# Patient Record
Sex: Female | Born: 1980 | Race: White | Hispanic: No | Smoking: Current every day smoker
Health system: Southern US, Community
[De-identification: ages and names within clinical notes are randomized; demographics above are authoritative.]

## PROBLEM LIST (undated history)

## (undated) DIAGNOSIS — E785 Hyperlipidemia, unspecified: Secondary | ICD-10-CM

## (undated) HISTORY — PX: TONSILLECTOMY: SUR1361

---

## 2002-03-16 ENCOUNTER — Emergency Department (HOSPITAL_COMMUNITY): Admission: EM | Admit: 2002-03-16 | Discharge: 2002-03-16 | Payer: Self-pay | Admitting: Emergency Medicine

## 2003-04-29 ENCOUNTER — Emergency Department (HOSPITAL_COMMUNITY): Admission: EM | Admit: 2003-04-29 | Discharge: 2003-04-29 | Payer: Self-pay | Admitting: Emergency Medicine

## 2003-04-29 ENCOUNTER — Encounter: Payer: Self-pay | Admitting: Emergency Medicine

## 2003-09-10 ENCOUNTER — Emergency Department (HOSPITAL_COMMUNITY): Admission: AD | Admit: 2003-09-10 | Discharge: 2003-09-10 | Payer: Self-pay | Admitting: Family Medicine

## 2003-11-23 ENCOUNTER — Emergency Department (HOSPITAL_COMMUNITY): Admission: EM | Admit: 2003-11-23 | Discharge: 2003-11-23 | Payer: Self-pay | Admitting: *Deleted

## 2003-12-11 ENCOUNTER — Ambulatory Visit (HOSPITAL_COMMUNITY): Admission: RE | Admit: 2003-12-11 | Discharge: 2003-12-11 | Payer: Self-pay | Admitting: Specialist

## 2004-01-20 ENCOUNTER — Encounter: Admission: RE | Admit: 2004-01-20 | Discharge: 2004-04-19 | Payer: Self-pay | Admitting: Specialist

## 2004-05-29 ENCOUNTER — Emergency Department (HOSPITAL_COMMUNITY): Admission: EM | Admit: 2004-05-29 | Discharge: 2004-05-29 | Payer: Self-pay | Admitting: Emergency Medicine

## 2004-08-15 ENCOUNTER — Emergency Department (HOSPITAL_COMMUNITY): Admission: EM | Admit: 2004-08-15 | Discharge: 2004-08-15 | Payer: Self-pay | Admitting: Family Medicine

## 2004-08-15 ENCOUNTER — Emergency Department (HOSPITAL_COMMUNITY): Admission: EM | Admit: 2004-08-15 | Discharge: 2004-08-15 | Payer: Self-pay | Admitting: *Deleted

## 2006-09-16 ENCOUNTER — Emergency Department (HOSPITAL_COMMUNITY): Admission: EM | Admit: 2006-09-16 | Discharge: 2006-09-16 | Payer: Self-pay | Admitting: Emergency Medicine

## 2014-02-06 DIAGNOSIS — M549 Dorsalgia, unspecified: Secondary | ICD-10-CM | POA: Insufficient documentation

## 2014-02-06 DIAGNOSIS — G8929 Other chronic pain: Secondary | ICD-10-CM | POA: Insufficient documentation

## 2016-04-07 ENCOUNTER — Other Ambulatory Visit: Payer: Self-pay | Admitting: Physical Medicine and Rehabilitation

## 2016-04-07 ENCOUNTER — Other Ambulatory Visit: Payer: Self-pay

## 2016-04-07 DIAGNOSIS — G971 Other reaction to spinal and lumbar puncture: Principal | ICD-10-CM

## 2017-11-15 DIAGNOSIS — R109 Unspecified abdominal pain: Secondary | ICD-10-CM

## 2017-11-15 DIAGNOSIS — K529 Noninfective gastroenteritis and colitis, unspecified: Secondary | ICD-10-CM

## 2017-11-15 DIAGNOSIS — K6389 Other specified diseases of intestine: Secondary | ICD-10-CM

## 2018-09-10 ENCOUNTER — Emergency Department (HOSPITAL_COMMUNITY): Payer: Self-pay

## 2018-09-10 ENCOUNTER — Emergency Department (HOSPITAL_COMMUNITY)
Admission: EM | Admit: 2018-09-10 | Discharge: 2018-09-10 | Disposition: A | Discharge status: Home / Self Care | Payer: Self-pay | Attending: Emergency Medicine | Admitting: Emergency Medicine

## 2018-09-10 ENCOUNTER — Encounter (HOSPITAL_COMMUNITY): Payer: Self-pay | Admitting: Emergency Medicine

## 2018-09-10 DIAGNOSIS — F17210 Nicotine dependence, cigarettes, uncomplicated: Secondary | ICD-10-CM | POA: Insufficient documentation

## 2018-09-10 DIAGNOSIS — B9789 Other viral agents as the cause of diseases classified elsewhere: Secondary | ICD-10-CM

## 2018-09-10 DIAGNOSIS — J069 Acute upper respiratory infection, unspecified: Secondary | ICD-10-CM | POA: Insufficient documentation

## 2018-09-10 LAB — POC URINE PREG, ED: Preg Test, Ur: NEGATIVE

## 2018-09-10 MED ORDER — BENZONATATE 100 MG PO CAPS: 100.0000 mg | ORAL_CAPSULE | Freq: Three times a day (TID) | ORAL | 0 refills | Status: DC

## 2018-09-10 NOTE — Discharge Instructions (Addendum)
You have been diagnosed today with upper respiratory infection with cough.  At this time there does not appear to be the presence of an emergent medical condition, however there is always the potential for conditions to worsen. Please read and follow the below instructions.  Please return to the Emergency Department immediately for any new or worsening symptoms or if your symptoms do not improve. Please be sure to follow up with your Primary Care Provider as soon as possible regarding your visit today; please call their office to schedule an appointment even if you are feeling better for a follow-up visit. You may use the [employer] as prescribed to help with your cough.  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  May use over-the-counter Tylenol as directed on the packaging for your symptoms.  Contact a health care provider if: You have new symptoms. You cough up pus. Your cough does not get better after 2-3 weeks, or your cough gets worse. You cannot control your cough with suppressant medicines and you are losing sleep. You develop pain that is getting worse or pain that is not controlled with pain medicines. You have a fever. You have unexplained weight loss. You have night sweats. Get help right away if: You cough up blood. You have difficulty breathing. Your heartbeat is very fast. Get help right away if: You have severe or persistent: Headache. Ear pain. Sinus pain. Chest pain. You have chronic lung disease and any of the following: Wheezing. Prolonged cough. Coughing up blood. A change in your usual mucus. You have a stiff neck. You have changes in your: Vision. Hearing. Thinking. Mood.  Please read the additional information packets attached to your discharge summary.  Do not take your medicine if  develop an itchy rash, swelling in your mouth or lips, or difficulty breathing.

## 2018-09-10 NOTE — ED Notes (Signed)
Bed: WTR8 Expected date:  Expected time:  Means of arrival:  Comments: 

## 2018-09-10 NOTE — ED Triage Notes (Signed)
Pt c/o cough, congestion, ear fullness, bilat rib pain from coughing. Sinus pains.

## 2018-09-10 NOTE — ED Provider Notes (Addendum)
[facility]-EMERGENCY DEPT Provider Note   CSN: [id number] Arrival date & time: 09/10/18  1128     History   Chief Complaint Chief Complaint  Patient presents with  . Cough  . Nasal Congestion    HPI Suzanne Myers is a 37 y.o. female presenting for 2 days of rhinorrhea, congestion, bilateral ear fullness and cough.  Patient states that her symptoms have gradually progressed for the last 2 days and have remained constant.  Patient describes her cough as a moderate in intensity, constant cough that is been productive with yellow sputum.  Patient denies hemoptysis.  Patient states that when she has severe coughing fits that she will have mild "rib pain "that improves immediately after she is done coughing.  Patient states that she had bilateral ear fullness for the last 2 days that has improved since this morning.  Patient has been taking "cough and cold medicine" unsure of which medications that this includes.  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  Patient states that she is unsure if she has had a fever however has felt warm over the past day.  Patient denies chest pain, shortness of breath, hemoptysis, extremity swelling/color change, recent surgery/immobilization, history of blood clot, exogenous hormone use.  Patient states that she is an otherwise healthy 37 year old female without history of chronic medical conditions and without daily medication use.  HPI  History reviewed. No pertinent past medical history.  There are no active problems to display for this patient.   Past Surgical History:  Procedure Laterality Date  . TONSILLECTOMY       OB History   None      Home Medications    Prior to Admission medications   Medication Sig Start Date End Date Taking? Authorizing Provider  benzonatate (TESSALON) 100 MG capsule Take 1 capsule (100 mg total) by mouth every 8 (eight) hours.  09/10/18   Bill Salinas, PA-C    Family History No family history on file.  Social History Social History   Tobacco Use  . Smoking status: Current Every Day Smoker    Types: Cigarettes  . Smokeless tobacco: Never Used  Substance Use Topics  . Alcohol use: Not on file  . Drug use: Not on file     Allergies   Patient has no allergy information on record.   Review of Systems Review of Systems  Constitutional: Negative.  Negative for chills, diaphoresis and fever.  HENT: Positive for congestion, ear pain, postnasal drip and rhinorrhea. Negative for drooling, facial swelling, sore throat, trouble swallowing and voice change.   Respiratory: Positive for cough. Negative for shortness of breath.   Cardiovascular: Negative.  Negative for chest pain and leg swelling.  Gastrointestinal: Negative.  Negative for abdominal pain, diarrhea, nausea and vomiting.  Musculoskeletal: Negative.  Negative for arthralgias and myalgias.  Skin: Negative.  Negative for rash.  Neurological: Negative.  Negative for dizziness, weakness and headaches.   Physical Exam Updated Vital Signs BP (!) 124/94 (BP Location: Left Arm)   Pulse 80   Temp 97.8 F (36.6 C) (Oral)   Resp 18   LMP 08/21/2018   SpO2 100%   Physical Exam  Constitutional: She is oriented to person, place, and time. She appears well-developed and well-nourished. No distress.  HENT:  Head: Normocephalic and atraumatic.  Right Ear: Hearing, tympanic membrane, external ear and ear canal normal.  Left Ear: Hearing, tympanic membrane, external  ear and ear canal normal.  Nose: Mucosal edema and rhinorrhea present.  Mouth/Throat: Uvula is midline and oropharynx is clear and moist. No trismus in the jaw. No uvula swelling. No oropharyngeal exudate, posterior oropharyngeal edema or posterior oropharyngeal erythema. Tonsils are 0 on the right. Tonsils are 0 on the left.  The patient has normal phonation and is in control of secretions.  No stridor.  Midline uvula without edema. Soft palate rises symmetrically. Tonsils appear absent; history of tonsillectomy. Tongue protrusion is normal, floor of mouth is soft. No trismus. No creptius on neck palpation and patient has good dentition. No gingival erythema or fluctuance noted. Mucus membranes moist. No pallor noted.  Patient with cobblestoning of the posterior oropharynx consistent with postnasal drip.  Eyes: Pupils are equal, round, and reactive to light. Conjunctivae and EOM are normal.  Neck: Trachea normal, normal range of motion, full passive range of motion without pain and phonation normal. Neck supple. No tracheal tenderness present. No tracheal deviation present.  Cardiovascular:  Pulses:      Dorsalis pedis pulses are 2+ on the right side, and 2+ on the left side.       Posterior tibial pulses are 2+ on the right side, and 2+ on the left side.  Pulmonary/Chest: Effort normal and breath sounds normal. No accessory muscle usage. No respiratory distress. She has no decreased breath sounds. She has no wheezes. She exhibits no crepitus and no deformity.  Patient with mild chest wall tenderness to palpation.  Abdominal: Soft. There is no tenderness. There is no rigidity, no rebound and no guarding.  Musculoskeletal: Normal range of motion.       Right upper arm: Normal.       Left upper arm: Normal.       Right lower leg: Normal.       Left lower leg: Normal.  Neurological: She is alert and oriented to person, place, and time. GCS eye subscore is 4. GCS verbal subscore is 5. GCS motor subscore is 6.  Speech is clear and goal oriented, follows commands Major Cranial nerves without deficit, no facial droop Normal strength in upper and lower extremities bilaterally including dorsiflexion and plantar flexion, strong and equal grip strength Sensation normal to light touch Moves extremities without ataxia, coordination intact Normal gait  Skin: Skin is warm and dry.    Psychiatric: She has a normal mood and affect. Her behavior is normal.   ED Treatments / Results  Labs (all labs ordered are listed, but only abnormal results are displayed) Labs Reviewed  POC URINE PREG, ED    EKG None  Radiology Dg Chest 2 View  Result Date: 09/10/2018 CLINICAL DATA:  Cough and chest congestion. EXAM: CHEST - 2 VIEW COMPARISON:  None. FINDINGS: Heart size is normal. Mediastinal shadows are normal. The lungs are clear. No bronchial thickening. No infiltrate, mass, effusion or collapse. Pulmonary vascularity is normal. No bony abnormality. IMPRESSION: Normal chest Electronically Signed   By: Paulina Fusi M.D.   On: 09/10/2018 15:14    Procedures Procedures (including critical care time)  Medications Ordered in ED Medications - No data to display   Initial Impression / Assessment and Plan / ED Course  I have reviewed the triage vital signs and the nursing notes.  Pertinent labs & imaging results that were available during my care of the patient were reviewed by me and considered in my medical decision making (see chart for details).    Patient with symptoms consistent with URI  for 2 days.  Patient's CXR is negative for acute infiltrate. Symptoms are likely of viral etiology. Discussed that antibiotics are not indicated for viral infections. Patient will be discharged with symptomatic treatment. Patient verbalizes understanding and is agreeable with plan. Patient is hemodynamically stable and in no acute distress prior to discharge.  Patient with mild rib pain only present after coughing fits, mild tenderness to palpation of ribs.  No signs of injury.  No complaint of chest pain/shortness of breath.  Do not suspect ACS/PE at this time, suspect patient with mild muscular skeletal tenderness of following 2 days of coughing.  Patient is low risk Wells and PERC negative.  Patient has been prescribed Tessalon for her cough (denies breast feeding).  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   Patient is afebrile, not tachycardic, not hypotensive, not tachypneic, SPO2 100% on room air, well-appearing and in no acute distress.  At this time there does not appear to be any evidence of an acute emergency medical condition and the patient appears stable for discharge with appropriate outpatient follow up. Diagnosis was discussed with patient who verbalizes understanding of care plan and is agreeable to discharge. I have discussed return precautions with patient and family at bedside who verbalize understanding of return precautions. Patient strongly encouraged to follow-up with their PCP. All questions answered.   Note: Portions of this report may have been transcribed using voice recognition software. Every effort was made to ensure accuracy; however, inadvertent computerized transcription errors may still be present. Final Clinical Impressions(s) / ED Diagnoses   Final diagnoses:  Viral URI with cough    ED Discharge Orders         Ordered    benzonatate (TESSALON) 100 MG capsule  Every 8 hours     09/10/18 1604           Bill Salinas, PA-C 09/10/18 1832    Bill Salinas, PA-C 09/10/18 1832    Alvira Monday, MD 09/12/18 0017

## 2019-06-17 ENCOUNTER — Emergency Department
Admission: EM | Admit: 2019-06-17 | Discharge: 2019-06-17 | Disposition: A | Discharge status: Home / Self Care | Payer: Self-pay | Attending: Emergency Medicine | Admitting: Emergency Medicine

## 2019-06-17 ENCOUNTER — Emergency Department: Payer: Self-pay

## 2019-06-17 ENCOUNTER — Encounter: Payer: Self-pay | Admitting: Emergency Medicine

## 2019-06-17 ENCOUNTER — Other Ambulatory Visit: Payer: Self-pay

## 2019-06-17 DIAGNOSIS — Y939 Activity, unspecified: Secondary | ICD-10-CM | POA: Insufficient documentation

## 2019-06-17 DIAGNOSIS — F17200 Nicotine dependence, unspecified, uncomplicated: Secondary | ICD-10-CM | POA: Insufficient documentation

## 2019-06-17 DIAGNOSIS — Y929 Unspecified place or not applicable: Secondary | ICD-10-CM | POA: Insufficient documentation

## 2019-06-17 DIAGNOSIS — S20211A Contusion of right front wall of thorax, initial encounter: Secondary | ICD-10-CM | POA: Insufficient documentation

## 2019-06-17 DIAGNOSIS — S5012XA Contusion of left forearm, initial encounter: Secondary | ICD-10-CM | POA: Insufficient documentation

## 2019-06-17 DIAGNOSIS — Y999 Unspecified external cause status: Secondary | ICD-10-CM | POA: Insufficient documentation

## 2019-06-17 MED ORDER — OXYCODONE-ACETAMINOPHEN 5-325 MG PO TABS: 1.0000 | ORAL_TABLET | Freq: Four times a day (QID) | ORAL | 0 refills | Status: DC | PRN

## 2019-06-17 MED ORDER — METHOCARBAMOL 500 MG PO TABS: 500.0000 mg | ORAL_TABLET | Freq: Four times a day (QID) | ORAL | 0 refills | Status: DC

## 2019-06-17 MED ORDER — MELOXICAM 15 MG PO TABS: 15.0000 mg | ORAL_TABLET | Freq: Every day | ORAL | 0 refills | Status: DC

## 2019-06-17 MED ORDER — ONDANSETRON HCL 4 MG/2ML IJ SOLN: 4.0000 mg | Freq: Once | INTRAMUSCULAR | Status: DC

## 2019-06-17 MED ORDER — MELOXICAM 7.5 MG PO TABS
15.0000 mg | ORAL_TABLET | Freq: Once | ORAL | Status: AC
  Administered 2019-06-17: 15 mg via ORAL
  Filled 2019-06-17: qty 2

## 2019-06-17 MED ORDER — ONDANSETRON 4 MG PO TBDP
4.0000 mg | ORAL_TABLET | Freq: Once | ORAL | Status: AC
  Administered 2019-06-17: 4 mg via ORAL
  Filled 2019-06-17: qty 1

## 2019-06-17 MED ORDER — OXYCODONE-ACETAMINOPHEN 5-325 MG PO TABS
1.0000 | ORAL_TABLET | Freq: Once | ORAL | Status: AC
  Administered 2019-06-17: 1 via ORAL
  Filled 2019-06-17: qty 1
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
## 2019-06-18 ENCOUNTER — Encounter (HOSPITAL_COMMUNITY): Payer: Self-pay | Admitting: Emergency Medicine
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
## 2021-02-28 ENCOUNTER — Emergency Department (HOSPITAL_BASED_OUTPATIENT_CLINIC_OR_DEPARTMENT_OTHER)
Admission: EM | Admit: 2021-02-28 | Discharge: 2021-02-28 | Disposition: A | Discharge status: Home / Self Care | Payer: 59 | Attending: Emergency Medicine | Admitting: Emergency Medicine

## 2021-02-28 ENCOUNTER — Other Ambulatory Visit: Payer: Self-pay

## 2021-02-28 DIAGNOSIS — F17210 Nicotine dependence, cigarettes, uncomplicated: Secondary | ICD-10-CM | POA: Diagnosis not present

## 2021-02-28 DIAGNOSIS — L309 Dermatitis, unspecified: Secondary | ICD-10-CM

## 2021-02-28 DIAGNOSIS — R21 Rash and other nonspecific skin eruption: Secondary | ICD-10-CM | POA: Insufficient documentation

## 2021-02-28 MED ORDER — PREDNISONE 20 MG PO TABS: ORAL_TABLET | ORAL | 0 refills | Status: DC

## 2021-02-28 MED ORDER — FAMOTIDINE 20 MG PO TABS
20.0000 mg | ORAL_TABLET | Freq: Once | ORAL | Status: AC
  Administered 2021-02-28: 20 mg via ORAL
  Filled 2021-02-28: qty 1

## 2021-02-28 MED ORDER — DIPHENHYDRAMINE HCL 25 MG PO TABS: 25.0000 mg | ORAL_TABLET | Freq: Four times a day (QID) | ORAL | 0 refills | Status: DC | PRN

## 2021-02-28 MED ORDER — PREDNISONE 50 MG PO TABS
60.0000 mg | ORAL_TABLET | Freq: Once | ORAL | Status: AC
  Administered 2021-02-28: 60 mg via ORAL
  Filled 2021-02-28: qty 1

## 2021-02-28 MED ORDER — DIPHENHYDRAMINE HCL 25 MG PO CAPS
25.0000 mg | ORAL_CAPSULE | Freq: Once | ORAL | Status: AC
  Administered 2021-02-28: 25 mg via ORAL
  Filled 2021-02-28: qty 1

## 2021-02-28 NOTE — ED Provider Notes (Signed)
[facility] EMERGENCY DEPT Provider Note   CSN: [id number] Arrival date & time: 02/28/21  0944     History Chief Complaint  Patient presents with  . Rash    Suzanne Myers is a 40 y.o. female.  Patient c/o itchy rash all over body since yesterday. Symptoms acute onset, moderate, sparse, sl raised papules, states yesterday noted a few areas that appeared c/w hives. Denies hx same rash. Denies any change in home or personal products. No change in foods. No recent new meds/med use. Has not taken anything since rash/itching started. Denies outdoor/plant exposures. No recent viral/febrile illness or uri symptoms. No known ill contacts. No fever or chills. Denies change in sleeping areas/sheets or hotel. States other than rash does not feel sick or ill. No mucous membrane or oral lesions.   The history is provided by the patient.  Rash Associated symptoms: no abdominal pain, no diarrhea, no fever, no headaches, no myalgias, no sore throat and not vomiting        No past medical history on file.  There are no problems to display for this patient.   Past Surgical History:  Procedure Laterality Date  . TONSILLECTOMY       OB History   No obstetric history on file.     No family history on file.  Social History   Tobacco Use  . Smoking status: Current Every Day Smoker    Types: Cigarettes  . Smokeless tobacco: Never Used  Vaping Use  . Vaping Use: Every day  Substance Use Topics  . Alcohol use: Not Currently    Home Medications Prior to Admission medications   Medication Sig Start Date End Date Taking? Authorizing Provider  benzonatate (TESSALON) 100 MG capsule Take 1 capsule (100 mg total) by mouth every 8 (eight) hours. 09/10/18   Harlene Salts A, PA-C  meloxicam (MOBIC) 15 MG tablet Take 1 tablet (15 mg total) by mouth daily. 06/17/19   Cuthriell, Delorise Royals, PA-C  methocarbamol (ROBAXIN) 500 MG tablet Take 1 tablet (500 mg total) by mouth 4  (four) times daily. 06/17/19   Cuthriell, Delorise Royals, PA-C  oxyCODONE-acetaminophen (PERCOCET/ROXICET) 5-325 MG tablet Take 1 tablet by mouth every 6 (six) hours as needed for severe pain. 06/17/19   Cuthriell, Delorise Royals, PA-C    Allergies    Codeine  Review of Systems   Review of Systems  Constitutional: Negative for chills and fever.  HENT: Negative for sore throat.   Eyes: Negative for redness and itching.  Respiratory: Negative for cough.   Gastrointestinal: Negative for abdominal pain, diarrhea and vomiting.  Genitourinary: Negative for dysuria and vaginal bleeding.  Musculoskeletal: Negative for myalgias.  Skin: Positive for rash.  Neurological: Negative for headaches.  Hematological: Does not bruise/bleed easily.  Psychiatric/Behavioral: Negative for confusion.    Physical Exam Updated Vital Signs BP (!) 131/99 (BP Location: Left Arm)   Pulse (!) 104   Temp 98.4 F (36.9 C) (Oral)   Resp 18   Ht 1.626 m (5\' 4" )   Wt 86.2 kg   SpO2 98%   BMI 32.61 kg/m   Physical Exam Vitals and nursing note reviewed.  Constitutional:      Appearance: Normal appearance. She is well-developed.  HENT:     Head: Atraumatic.     Nose: Nose normal.     Mouth/Throat:     Mouth: Mucous membranes are moist.     Pharynx: Oropharynx is clear.     Comments: No mm lesions.  Eyes:     General: No scleral icterus.    Conjunctiva/sclera: Conjunctivae normal.     Pupils: Pupils are equal, round, and reactive to light.  Neck:     Trachea: No tracheal deviation.  Cardiovascular:     Rate and Rhythm: Normal rate and regular rhythm.     Pulses: Normal pulses.     Heart sounds: Normal heart sounds. No murmur heard. No friction rub. No gallop.   Pulmonary:     Effort: Pulmonary effort is normal. No respiratory distress.     Breath sounds: Normal breath sounds.  Abdominal:     General: Bowel sounds are normal. There is no distension.     Palpations: Abdomen is soft.     Tenderness: There  is no abdominal tenderness. There is no guarding.  Genitourinary:    Comments: No cva tenderness.  Musculoskeletal:        General: No swelling.     Cervical back: Normal range of motion and neck supple. No rigidity. No muscular tenderness.  Skin:    General: Skin is warm and dry.     Comments: Sparse, mildly erythematous, slightly raised 1-2 mm papular rash to bilateral extremities and trunk. No mm lesions. No palms or soles. No definite areas c/w scabies noted.   Neurological:     Mental Status: She is alert.     Comments: Alert, speech normal.   Psychiatric:        Mood and Affect: Mood normal.     ED Results / Procedures / Treatments   Labs (all labs ordered are listed, but only abnormal results are displayed) Labs Reviewed - No data to display  EKG None  Radiology No results found.  Procedures Procedures   Medications Ordered in ED Medications  diphenhydrAMINE (BENADRYL) capsule 25 mg (25 mg Oral Given 02/28/21 1038)  famotidine (PEPCID) tablet 20 mg (20 mg Oral Given 02/28/21 1038)  predniSONE (DELTASONE) tablet 60 mg (60 mg Oral Given 02/28/21 1038)    ED Course  I have reviewed the triage vital signs and the nursing notes.  Pertinent labs & imaging results that were available during my care of the patient were reviewed by me and considered in my medical decision making (see chart for details).    MDM Rules/Calculators/A&P                         No meds pta.   Benadryl po, pepcid po, prednisone po.  Reviewed nursing notes and prior charts for additional history.   Rec pcp/derm f/u 1 week if symptoms fail to improve/resolve.   Final Clinical Impression(s) / ED Diagnoses Final diagnoses:  None    Rx / DC Orders ED Discharge Orders    None       Cathren Laine, MD 02/28/21 1048

## 2021-02-28 NOTE — ED Triage Notes (Signed)
Pt via pov from work with rash all over her body that she states is itchy and burning. Pt reports that it started last night and that she has never had anything like it before. She also denies any new food or drink or laundry detergent. Pt alert & oriented, nad noted.

## 2021-02-28 NOTE — Discharge Instructions (Addendum)
It was our pleasure to provide your ER care today - we hope that you feel better.  Keep skin clean/dry, avoiding any perfumed soaps, lotions or detergents.   Take prednisone as prescribed. Take benadryl as need.  Follow up with primary care doctor or dermatologist in 1 week if symptoms fail to improve/resolve.  Return to ER if worse, new symptoms, fevers, trouble breathing, or other concern.

## 2021-08-13 ENCOUNTER — Other Ambulatory Visit: Payer: Self-pay

## 2021-08-13 ENCOUNTER — Ambulatory Visit
Admission: RE | Admit: 2021-08-13 | Discharge: 2021-08-13 | Disposition: A | Discharge status: Home / Self Care | Payer: 59 | Source: Ambulatory Visit

## 2021-08-13 VITALS — BP 110/71 | HR 113 | Temp 98.0°F | Resp 18

## 2021-08-13 DIAGNOSIS — M546 Pain in thoracic spine: Secondary | ICD-10-CM | POA: Diagnosis not present

## 2021-08-13 DIAGNOSIS — S46812A Strain of other muscles, fascia and tendons at shoulder and upper arm level, left arm, initial encounter: Secondary | ICD-10-CM | POA: Diagnosis not present

## 2021-08-13 MED ORDER — CYCLOBENZAPRINE HCL 5 MG PO TABS: 5.0000 mg | ORAL_TABLET | Freq: Two times a day (BID) | ORAL | 0 refills | Status: DC | PRN

## 2021-08-13 MED ORDER — IBUPROFEN 600 MG PO TABS: 600.0000 mg | ORAL_TABLET | Freq: Four times a day (QID) | ORAL | 0 refills | Status: DC | PRN
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
## 2021-08-13 NOTE — ED Provider Notes (Signed)
[name]    CSN: [id number] Arrival date & time: 08/13/21  1249      History   Chief Complaint Chief Complaint  Patient presents with  . Back Pain    HPI Suzanne Myers is a 40 y.o. female.   Patient presents with left upper back pain that has been present for approximately 1 week.  Patient reports that pain started in the neck and has now moved down towards the left shoulder blade.  Movement exacerbates pain.  Denies any apparent injury.  Has taken ibuprofen and Tylenol with minimal improvement in symptoms.  Denies chest pain or shortness of breath.   Back Pain  History reviewed. No pertinent past medical history.  There are no problems to display for this patient.   Past Surgical History:  Procedure Laterality Date  . TONSILLECTOMY      OB History   No obstetric history on file.      Home Medications    Prior to Admission medications   Medication Sig Start Date End Date Taking? Authorizing Provider  cyclobenzaprine (FLEXERIL) 5 MG tablet Take 1 tablet (5 mg total) by mouth 2 (two) times daily as needed for muscle spasms. 08/13/21  Yes Lance Muss, FNP  ibuprofen (ADVIL) 600 MG tablet Take 1 tablet (600 mg total) by mouth every 6 (six) hours as needed for mild pain or moderate pain. 08/13/21  Yes Lance Muss, FNP  benzonatate (TESSALON) 100 MG capsule Take 1 capsule (100 mg total) by mouth every 8 (eight) hours. 09/10/18   Harlene Salts A, PA-C  diphenhydrAMINE (BENADRYL) 25 MG tablet Take 1 tablet (25 mg total) by mouth every 6 (six) hours as needed. 02/28/21   Cathren Laine, MD  meloxicam (MOBIC) 15 MG tablet Take 1 tablet (15 mg total) by mouth daily. 06/17/19   Cuthriell, Delorise Royals, PA-C  methocarbamol (ROBAXIN) 500 MG tablet Take 1 tablet (500 mg total) by mouth 4 (four) times daily. 06/17/19   Cuthriell, Delorise Royals, PA-C  oxyCODONE-acetaminophen (PERCOCET/ROXICET) 5-325 MG tablet Take 1 tablet by mouth every 6 (six) hours as needed  for severe pain. 06/17/19   Cuthriell, Delorise Royals, PA-C  phentermine 37.5 MG capsule Take 37.5 mg by mouth daily. 08/05/21   [provider]  predniSONE (DELTASONE) 20 MG tablet 3 po once a day for 2 days, then 2 po once a day for 3 days, then 1 po once a day for 3 days 03/01/21   Cathren Laine, MD    Family History History reviewed. No pertinent family history.  Social History Social History   Tobacco Use  . Smoking status: Every Day    Types: Cigarettes  . Smokeless tobacco: Never  Vaping Use  . Vaping Use: Every day  Substance Use Topics  . Alcohol use: Not Currently     Allergies   Codeine   Review of Systems Review of Systems Per HPI  Physical Exam Triage Vital Signs ED Triage Vitals [08/13/21 1341]  Enc Vitals Group     BP 110/71     Pulse Rate (!) 113     Resp 18     Temp 98 F (36.7 C)     Temp Source Oral     SpO2 98 %     Weight      Height      Head Circumference      Peak Flow      Pain Score 7     Pain Loc  Pain Edu?      Excl. in [place]?    No data found.  Updated Vital Signs BP 110/71 (BP Location: Left Arm)   Pulse (!) 113   Temp 98 F (36.7 C) (Oral)   Resp 18   SpO2 98%   Visual Acuity Right Eye Distance:   Left Eye Distance:   Bilateral Distance:    Right Eye Near:   Left Eye Near:    Bilateral Near:     Physical Exam Constitutional:      General: She is not in acute distress.    Appearance: Normal appearance. She is not toxic-appearing or diaphoretic.  HENT:     Head: Normocephalic and atraumatic.  Eyes:     Extraocular Movements: Extraocular movements intact.     Conjunctiva/sclera: Conjunctivae normal.  Pulmonary:     Effort: Pulmonary effort is normal.  Musculoskeletal:     Cervical back: Tenderness present. No swelling or bony tenderness. Pain with movement present. Normal range of motion.     Thoracic back: Normal.     Lumbar back: Normal.       Back:     Comments: Tenderness to palpation to left  trapezius muscle.  No step-off or direct spinal tenderness.  Skin:    General: Skin is warm and dry.  Neurological:     General: No focal deficit present.     Mental Status: She is alert and oriented to person, place, and time. Mental status is at baseline.  Psychiatric:        Mood and Affect: Mood normal.        Behavior: Behavior normal.        Thought Content: Thought content normal.        Judgment: Judgment normal.     UC Treatments / Results  Labs (all labs ordered are listed, but only abnormal results are displayed) Labs Reviewed - No data to display  EKG   Radiology No results found.  Procedures Procedures (including critical care time)  Medications Ordered in UC Medications - No data to display  Initial Impression / Assessment and Plan / UC Course  I have reviewed the triage vital signs and the nursing notes.  Pertinent labs & imaging results that were available during my care of the patient were reviewed by me and considered in my medical decision making (see chart for details).     Patient's physical exam is most consistent with muscular strain.  Will treat with ibuprofen and muscle relaxer.  Advised patient that muscle relaxer can cause drowsiness.  Patient to follow-up if pain persists.  No red flags seen on exam.  Do not think that imaging is necessary due to area of tenderness on palpation.  Discussed strict return precautions. Patient verbalized understanding and is agreeable with plan.  Final Clinical Impressions(s) / UC Diagnoses   Final diagnoses:  Strain of left trapezius muscle, initial encounter  Acute left-sided thoracic back pain     Discharge Instructions      It seems that you have a muscle strain of your back at your trapezius muscle.  You have been prescribed 2 medications to help alleviate this pain.  Also alternate ice and heat.  Follow-up if pain persists.     ED Prescriptions     Medication Sig Dispense Auth. Provider    cyclobenzaprine (FLEXERIL) 5 MG tablet Take 1 tablet (5 mg total) by mouth 2 (two) times daily as needed for muscle spasms. 15 tablet Lance Muss, FNP  ibuprofen (ADVIL) 600 MG tablet Take 1 tablet (600 mg total) by mouth every 6 (six) hours as needed for mild pain or moderate pain. 30 tablet Lance Muss, FNP      PDMP not reviewed this encounter.   Lance Muss, FNP 08/13/21 1430

## 2021-08-13 NOTE — Discharge Instructions (Addendum)
It seems that you have a muscle strain of your back at your trapezius muscle.  You have been prescribed 2 medications to help alleviate this pain.  Also alternate ice and heat.  Follow-up if pain persists.

## 2021-09-29 ENCOUNTER — Emergency Department (HOSPITAL_COMMUNITY): Payer: 59

## 2021-09-29 ENCOUNTER — Emergency Department (HOSPITAL_COMMUNITY)
Admission: EM | Admit: 2021-09-29 | Discharge: 2021-09-29 | Disposition: A | Discharge status: Home / Self Care | Payer: 59 | Attending: Emergency Medicine | Admitting: Emergency Medicine

## 2021-09-29 ENCOUNTER — Other Ambulatory Visit: Payer: Self-pay

## 2021-09-29 DIAGNOSIS — W25XXXA Contact with sharp glass, initial encounter: Secondary | ICD-10-CM | POA: Diagnosis not present

## 2021-09-29 DIAGNOSIS — S41112A Laceration without foreign body of left upper arm, initial encounter: Secondary | ICD-10-CM | POA: Diagnosis present

## 2021-09-29 DIAGNOSIS — R000 Tachycardia, unspecified: Secondary | ICD-10-CM | POA: Insufficient documentation

## 2021-09-29 DIAGNOSIS — F17210 Nicotine dependence, cigarettes, uncomplicated: Secondary | ICD-10-CM | POA: Diagnosis not present

## 2021-09-29 DIAGNOSIS — Y93E5 Activity, floor mopping and cleaning: Secondary | ICD-10-CM | POA: Diagnosis not present

## 2021-09-29 MED ORDER — IBUPROFEN 400 MG PO TABS
600.0000 mg | ORAL_TABLET | Freq: Once | ORAL | Status: AC
  Administered 2021-09-29: 600 mg via ORAL
  Filled 2021-09-29: qty 1

## 2021-09-29 MED ORDER — LIDOCAINE-EPINEPHRINE (PF) 2 %-1:200000 IJ SOLN
10.0000 mL | Freq: Once | INTRAMUSCULAR | Status: AC
  Administered 2021-09-29: 10 mL via INTRADERMAL
  Filled 2021-09-29: qty 20

## 2021-09-29 NOTE — ED Triage Notes (Signed)
Pt was cleaning and dropped glass jar. When cleaning it up she was holding it in her arm and slid out. 1.5-2 inch Laceration to the left lower arm. Bleeding controlled.

## 2021-09-29 NOTE — ED Provider Notes (Signed)
[facility] EMERGENCY DEPARTMENT Provider Note   CSN: [id number] Arrival date & time: 09/29/21  1605     History Chief Complaint  Patient presents with   Extremity Laceration    Suzanne Myers is a 40 y.o. female who presents with concern for laceration to her left distal forearm. She states she was getting a glass dish down from on top of her refrigerator, when she dropped it and is shattered on the floor. She states she tucked a large piece of it between her forearm and her chest while she was trying to clean up; it slipped and cut her forearm. She states it bled quite a bit so she called EMS who evaluated her and said she should present for repair. She arrives POV.   I have personally reviewed this patient's medical record. She denies any thoughts of self-harm, SI, HI, AVH.   HPI     No past medical history on file.  There are no problems to display for this patient.   Past Surgical History:  Procedure Laterality Date   TONSILLECTOMY       OB History   No obstetric history on file.     No family history on file.  Social History   Tobacco Use   Smoking status: Every Day    Types: Cigarettes   Smokeless tobacco: Never  Vaping Use   Vaping Use: Every day  Substance Use Topics   Alcohol use: Not Currently    Home Medications Prior to Admission medications   Medication Sig Start Date End Date Taking? Authorizing Provider  benzonatate (TESSALON) 100 MG capsule Take 1 capsule (100 mg total) by mouth every 8 (eight) hours. 09/10/18   Harlene Salts A, PA-C  cyclobenzaprine (FLEXERIL) 5 MG tablet Take 1 tablet (5 mg total) by mouth 2 (two) times daily as needed for muscle spasms. 08/13/21   Gustavus Bryant, FNP  diphenhydrAMINE (BENADRYL) 25 MG tablet Take 1 tablet (25 mg total) by mouth every 6 (six) hours as needed. 02/28/21   Cathren Laine, MD  ibuprofen (ADVIL) 600 MG tablet Take 1 tablet (600 mg total) by mouth every 6 (six) hours as needed  for mild pain or moderate pain. 08/13/21   Gustavus Bryant, FNP  meloxicam (MOBIC) 15 MG tablet Take 1 tablet (15 mg total) by mouth daily. 06/17/19   Cuthriell, Delorise Royals, PA-C  methocarbamol (ROBAXIN) 500 MG tablet Take 1 tablet (500 mg total) by mouth 4 (four) times daily. 06/17/19   Cuthriell, Delorise Royals, PA-C  oxyCODONE-acetaminophen (PERCOCET/ROXICET) 5-325 MG tablet Take 1 tablet by mouth every 6 (six) hours as needed for severe pain. 06/17/19   Cuthriell, Delorise Royals, PA-C  phentermine 37.5 MG capsule Take 37.5 mg by mouth daily. 08/05/21   [provider]  predniSONE (DELTASONE) 20 MG tablet 3 po once a day for 2 days, then 2 po once a day for 3 days, then 1 po once a day for 3 days 03/01/21   Cathren Laine, MD    Allergies    Codeine  Review of Systems   Review of Systems  Constitutional: Negative.   HENT: Negative.    Eyes: Negative.   Respiratory: Negative.    Cardiovascular: Negative.   Gastrointestinal: Negative.   Skin:  Positive for wound.  Neurological: Negative.   Hematological: Negative.   Psychiatric/Behavioral: Negative.     Physical Exam Updated Vital Signs BP 122/84 (BP Location: Right Arm)   Pulse 87   Temp 98  F (36.7 C) (Oral)   Resp 13   SpO2 100%   Physical Exam Vitals and nursing note reviewed.  HENT:     Head: Normocephalic and atraumatic.  Eyes:     General: No scleral icterus.       Right eye: No discharge.        Left eye: No discharge.     Conjunctiva/sclera: Conjunctivae normal.  Pulmonary:     Effort: Pulmonary effort is normal.  Musculoskeletal:       Arms:     Comments: 2+ radial pulses bilaterally, FROM of the left wrist in flexion and extension. Normal sensation and cap refill in all 5 digits of the left hand.   Skin:    General: Skin is warm and dry.     Capillary Refill: Capillary refill takes less than 2 seconds.     Findings: Laceration present.  Neurological:     General: No focal deficit present.     Mental Status:  She is alert.     Sensory: Sensation is intact.     Motor: Motor function is intact.  Psychiatric:        Mood and Affect: Mood normal.    ED Results / Procedures / Treatments   Labs (all labs ordered are listed, but only abnormal results are displayed) Labs Reviewed - No data to display  EKG None  Radiology DG Forearm Left  Result Date: 09/29/2021 CLINICAL DATA:  Laceration with glass left forearm. EXAM: LEFT FOREARM - 2 VIEW COMPARISON:  None. FINDINGS: Evidence of patient's soft tissue laceration over the volar aspect of the distal forearm. No evidence of radiopaque foreign body. Underlying bony structures are normal. IMPRESSION: No acute fracture or radiopaque foreign body. Electronically Signed   By: Elberta Fortis M.D.   On: 09/29/2021 17:39    Procedures .Marland KitchenLaceration Repair  Date/Time: 09/29/2021 8:14 PM Performed by: Paris Lore, PA-C Authorized by: Paris Lore, PA-C   Consent:    Consent obtained:  Verbal   Consent given by:  Patient   Risks discussed:  Infection, need for additional repair, pain, poor cosmetic result and poor wound healing   Alternatives discussed:  No treatment and delayed treatment Universal protocol:    Procedure explained and questions answered to patient or proxy's satisfaction: yes     Relevant documents present and verified: yes     Test results available: yes     Imaging studies available: yes     Required blood products, implants, devices, and special equipment available: yes     Site/side marked: yes     Immediately prior to procedure, a time out was called: yes     Patient identity confirmed:  Verbally with patient Anesthesia:    Anesthesia method:  Local infiltration   Local anesthetic:  Lidocaine 2% WITH epi Laceration details:    Location:  Shoulder/arm   Shoulder/arm location:  L lower arm   Length (cm):  4 Pre-procedure details:    Preparation:  Patient was prepped and draped in usual sterile fashion and  imaging obtained to evaluate for foreign bodies Exploration:    Hemostasis achieved with:  Epinephrine   Imaging obtained: x-ray     Imaging outcome: foreign body not noted     Wound extent: no foreign bodies/material noted, no tendon damage noted and no underlying fracture noted     Contaminated: no   Treatment:    Area cleansed with:  Saline and Shur-Clens   Amount of cleaning:  Standard   Irrigation solution:  Sterile saline Skin repair:    Repair method:  Sutures   Suture size:  4-0   Suture material:  Prolene   Suture technique:  Simple interrupted   Number of sutures:  6 Approximation:    Approximation:  Close Repair type:    Repair type:  Simple Post-procedure details:    Dressing:  Antibiotic ointment and non-adherent dressing   Procedure completion:  Tolerated well, no immediate complications   Medications Ordered in ED Medications  ibuprofen (ADVIL) tablet 600 mg (600 mg Oral Given 09/29/21 1806)  lidocaine-EPINEPHrine (XYLOCAINE W/EPI) 2 %-1:200000 (PF) injection 10 mL (10 mLs Intradermal Given 09/29/21 1913)    ED Course  I have reviewed the triage vital signs and the nursing notes.  Pertinent labs & imaging results that were available during my care of the patient were reviewed by me and considered in my medical decision making (see chart for details).    MDM Rules/Calculators/A&P                         40 year old female who presents with concern for laceration to the distal left forearm.   Mildly tachycardic on intake, VS otherwise normal. 4 cm lac to distal left forearm as above, patient neurovascularly intact distal to the injury.   Xray negative for retained FB, wound repaired as above. Patient UTD on tetanus. Remains neurovascularly intact after repair.   No further work up warranted in the ED at this time. Adonis voiced understanding of her medical evaluation and treatment plans, each of her questions was answered to her expressed satisfaction.  Return precautions were given. Patient is well-appearing, stable, and appropriate for discharge at this time.   This chart was dictated using voice recognition software, Dragon. Despite the best efforts of this provider to proofread and correct errors, errors may still occur which can change documentation meaning.   Final Clinical Impression(s) / ED Diagnoses Final diagnoses:  Laceration of left upper extremity, initial encounter    Rx / DC Orders ED Discharge Orders     None        Sherrilee Gilles 09/29/21 2017    Cheryll Cockayne, MD 10/05/21 1423

## 2021-09-29 NOTE — Discharge Instructions (Signed)
You were seen in the ER today for your arm injury. Your laceration was repaired with 6 stitches, which will need to be removed in 7 days. You may go to urgent care, the ER, or your PCP.   Dress it daily with antibiotic ointment.  Return to the ER if develop any redness, swelling, puslike discharge from the wound, numbness/tingling/weakness in your hand, or any other new severe symptom.

## 2021-09-29 NOTE — ED Notes (Signed)
Patient verbalizes understanding of discharge instructions. Opportunity for questioning and answers were provided. Armband removed by staff, pt discharged from ED ambulatory.   

## 2021-10-06 ENCOUNTER — Other Ambulatory Visit: Payer: Self-pay

## 2021-10-06 ENCOUNTER — Ambulatory Visit
Admission: EM | Admit: 2021-10-06 | Discharge: 2021-10-06 | Disposition: A | Discharge status: Home / Self Care | Payer: 59 | Attending: Physician Assistant | Admitting: Physician Assistant

## 2021-10-06 DIAGNOSIS — Z4802 Encounter for removal of sutures: Secondary | ICD-10-CM

## 2021-10-06 NOTE — ED Triage Notes (Signed)
Removed 6 sutures from lt anterior forearm. Small redness noted around sutures. Denies drainage. C/o burning on suture removal. Pt educated on signs of infection and when to return.

## 2022-02-01 ENCOUNTER — Ambulatory Visit
Admission: EM | Admit: 2022-02-01 | Discharge: 2022-02-01 | Disposition: A | Discharge status: Home / Self Care | Payer: 59

## 2022-02-01 ENCOUNTER — Other Ambulatory Visit: Payer: Self-pay

## 2022-02-01 DIAGNOSIS — G43009 Migraine without aura, not intractable, without status migrainosus: Secondary | ICD-10-CM | POA: Diagnosis not present

## 2022-02-01 HISTORY — DX: Hyperlipidemia, unspecified: E78.5

## 2022-02-01 MED ORDER — KETOROLAC TROMETHAMINE 30 MG/ML IJ SOLN
30.0000 mg | Freq: Once | INTRAMUSCULAR | Status: AC
  Administered 2022-02-01: 30 mg via INTRAMUSCULAR

## 2022-02-01 MED ORDER — ONDANSETRON 4 MG PO TBDP
4.0000 mg | ORAL_TABLET | Freq: Once | ORAL | Status: AC
  Administered 2022-02-01: 4 mg via ORAL

## 2022-02-01 MED ORDER — DEXAMETHASONE SODIUM PHOSPHATE 10 MG/ML IJ SOLN
10.0000 mg | Freq: Once | INTRAMUSCULAR | Status: AC
  Administered 2022-02-01: 10 mg via INTRAMUSCULAR

## 2022-02-01 NOTE — Discharge Instructions (Signed)
You have been given a migraine cocktail.  Please go to the hospital if symptoms do not improve or worsen in the next 24 to 48 hours.  Please follow-up with headache clinic due to persistent migraines. ?

## 2022-02-01 NOTE — ED Provider Notes (Signed)
?[facility] ? ? ? ?CSN: [id number] ?Arrival date & time: 02/01/22  1138 ? ? ?  ? ?History   ?Chief Complaint ?Chief Complaint  ?Patient presents with  ? Migraine  ?  Had it for 3 days now - Entered by patient  ? Headache  ? ? ?HPI ?Suzanne Myers is a 41 y.o. female.  ? ?Patient presents with migraine headache that has been present over the past 3 days.  She reports associated dizziness,nausea, vomiting, blurred vision.  Patient reports history of migraines that are usually relieved by over-the-counter NSAIDs.  although, patient reports she has taken ibuprofen yesterday and that this headache has not been able to be relieved by that.  She has never been seen by neurologist for headaches and never been officially diagnosed with migraines but reports she does have a history of them.  Denies any recent head injuries or any associated upper respiratory symptoms or fever.  Headache is not thunderclap in nature.  She reports head pain is located on the top of the head which is consistent with her migraines.  Patient also reports that her migraines have been more persistent and consistent over the past few months. ? ? ?Migraine ? ?Headache ? ?Past Medical History:  ?Diagnosis Date  ? Hyperlipemia   ? ? ?There are no problems to display for this patient. ? ? ?Past Surgical History:  ?Procedure Laterality Date  ? TONSILLECTOMY    ? ? ?OB History   ?No obstetric history on file. ?  ? ? ? ?Home Medications   ? ?Prior to Admission medications   ?Medication Sig Start Date End Date Taking? Authorizing Provider  ?benzonatate (TESSALON) 100 MG capsule Take 1 capsule (100 mg total) by mouth every 8 (eight) hours. 09/10/18   Nuala Alpha A, PA-C  ?cyclobenzaprine (FLEXERIL) 5 MG tablet Take 1 tablet (5 mg total) by mouth 2 (two) times daily as needed for muscle spasms. 08/13/21   Teodora Medici, FNP  ?diphenhydrAMINE (BENADRYL) 25 MG tablet Take 1 tablet (25 mg total) by mouth every 6 (six) hours as needed. 02/28/21    Lajean Saver, MD  ?ibuprofen (ADVIL) 600 MG tablet Take 1 tablet (600 mg total) by mouth every 6 (six) hours as needed for mild pain or moderate pain. 08/13/21   Teodora Medici, FNP  ?meloxicam (MOBIC) 15 MG tablet Take 1 tablet (15 mg total) by mouth daily. 06/17/19   Cuthriell, Charline Bills, PA-C  ?methocarbamol (ROBAXIN) 500 MG tablet Take 1 tablet (500 mg total) by mouth 4 (four) times daily. 06/17/19   Cuthriell, Charline Bills, PA-C  ?oxyCODONE-acetaminophen (PERCOCET/ROXICET) 5-325 MG tablet Take 1 tablet by mouth every 6 (six) hours as needed for severe pain. 06/17/19   Cuthriell, Charline Bills, PA-C  ?phentermine 37.5 MG capsule Take 37.5 mg by mouth daily. 08/05/21   [provider]  ?predniSONE (DELTASONE) 20 MG tablet 3 po once a day for 2 days, then 2 po once a day for 3 days, then 1 po once a day for 3 days 03/01/21   Lajean Saver, MD  ?rosuvastatin (CRESTOR) 5 MG tablet Take 5 mg by mouth daily. 01/25/22   [provider]  ?topiramate (TOPAMAX) 25 MG tablet Take 25 mg by mouth 2 (two) times daily. 01/25/22   [provider]  ? ? ?Family History ?History reviewed. No pertinent family history. ? ?Social History ?Social History  ? ?Tobacco Use  ? Smoking status: Every Day  ?  Types: Cigarettes  ?  Smokeless tobacco: Never  ?Vaping Use  ? Vaping Use: Every day  ?Substance Use Topics  ? Alcohol use: Not Currently  ? ? ? ?Allergies   ?Codeine ? ? ?Review of Systems ?Review of Systems ?Per HPI ? ?Physical Exam ?Triage Vital Signs ?ED Triage Vitals  ?Enc Vitals Group  ?   BP 02/01/22 1242 (!) 127/96  ?   Pulse Rate 02/01/22 1242 90  ?   Resp 02/01/22 1242 18  ?   Temp 02/01/22 1242 98 ?F (36.7 ?C)  ?   Temp Source 02/01/22 1242 Oral  ?   SpO2 02/01/22 1242 98 %  ?   Weight --   ?   Height --   ?   Head Circumference --   ?   Peak Flow --   ?   Pain Score 02/01/22 1243 0  ?   Pain Loc --   ?   Pain Edu? --   ?   Excl. in [place]? --   ? ?No data found. ? ?Updated Vital Signs ?BP (!) 127/96 (BP Location:  Left Arm)   Pulse 90   Temp 98 ?F (36.7 ?C) (Oral)   Resp 18   SpO2 98%  ? ?Visual Acuity ?Right Eye Distance:   ?Left Eye Distance:   ?Bilateral Distance:   ? ?Right Eye Near:   ?Left Eye Near:    ?Bilateral Near:    ? ?Physical Exam ?Constitutional:   ?   General: She is not in acute distress. ?   Appearance: Normal appearance. She is not toxic-appearing or diaphoretic.  ?HENT:  ?   Head: Normocephalic and atraumatic.  ?Eyes:  ?   Extraocular Movements: Extraocular movements intact.  ?   Conjunctiva/sclera: Conjunctivae normal.  ?   Pupils: Pupils are equal, round, and reactive to light.  ?Cardiovascular:  ?   Rate and Rhythm: Normal rate and regular rhythm.  ?   Pulses: Normal pulses.  ?   Heart sounds: Normal heart sounds.  ?Pulmonary:  ?   Effort: Pulmonary effort is normal. No respiratory distress.  ?   Breath sounds: Normal breath sounds.  ?Neurological:  ?   General: No focal deficit present.  ?   Mental Status: She is alert and oriented to person, place, and time. Mental status is at baseline.  ?   Cranial Nerves: Cranial nerves 2-12 are intact.  ?   Sensory: Sensation is intact.  ?   Motor: Motor function is intact.  ?   Coordination: Coordination is intact.  ?   Gait: Gait is intact.  ?Psychiatric:     ?   Mood and Affect: Mood normal.     ?   Behavior: Behavior normal.     ?   Thought Content: Thought content normal.     ?   Judgment: Judgment normal.  ? ? ? ?UC Treatments / Results  ?Labs ?(all labs ordered are listed, but only abnormal results are displayed) ?Labs Reviewed - No data to display ? ?EKG ? ? ?Radiology ?No results found. ? ?Procedures ?Procedures (including critical care time) ? ?Medications Ordered in UC ?Medications  ?ketorolac (TORADOL) 30 MG/ML injection 30 mg (30 mg Intramuscular Given 02/01/22 1307)  ?ondansetron (ZOFRAN-ODT) disintegrating tablet 4 mg (4 mg Oral Given 02/01/22 1306)  ?dexamethasone (DECADRON) injection 10 mg (10 mg Intramuscular Given 02/01/22 1308)  ? ? ?Initial  Impression / Assessment and Plan / UC Course  ?I have reviewed the triage vital signs and the nursing  notes. ? ?Pertinent labs & imaging results that were available during my care of the patient were reviewed by me and considered in my medical decision making (see chart for details). ? ?  ? ?Physical exam does appear consistent with patient's history of migraines.  Neuro exam and physical exam are normal so do not think that patient is in need of CT imaging of the head at this time.  Migraine cocktail administered in urgent care.  Patient was advised of strict ER precautions and advised to go to the ER if symptoms persist or worsen in the next 24 to 48 hours.  She was also given contact information for headache clinic for further evaluation and management of consistent headaches.  Patient verbalized understanding and was agreeable with plan. ?Final Clinical Impressions(s) / UC Diagnoses  ? ?Final diagnoses:  ?Migraine without aura and without status migrainosus, not intractable  ? ? ? ?Discharge Instructions   ? ?  ?You have been given a migraine cocktail.  Please go to the hospital if symptoms do not improve or worsen in the next 24 to 48 hours.  Please follow-up with headache clinic due to persistent migraines. ? ? ? ?ED Prescriptions   ?None ?  ? ?PDMP not reviewed this encounter. ?  ?Teodora Medici,  ?02/01/22 1313 ? ?

## 2022-02-01 NOTE — ED Triage Notes (Signed)
Pt c/o severe headache x 3 days. States has hx of them but has been more frequent over last few denies. Denies formal dx of migraine or treatment for migraine. States her topamax is for weight management.  ?

## 2022-04-30 ENCOUNTER — Ambulatory Visit (INDEPENDENT_AMBULATORY_CARE_PROVIDER_SITE_OTHER): Payer: Commercial Managed Care - HMO

## 2022-04-30 ENCOUNTER — Encounter: Payer: Self-pay | Admitting: Emergency Medicine

## 2022-04-30 ENCOUNTER — Ambulatory Visit
Admission: EM | Admit: 2022-04-30 | Discharge: 2022-04-30 | Disposition: A | Discharge status: Home / Self Care | Payer: Commercial Managed Care - HMO | Attending: Physician Assistant | Admitting: Physician Assistant

## 2022-04-30 DIAGNOSIS — M79671 Pain in right foot: Secondary | ICD-10-CM

## 2022-04-30 NOTE — ED Triage Notes (Signed)
Pt reports right foot pain and swelling. States 2 weeks ago she bent foot backwards while trying to sit on her foot. States since then top of her foot has been very tender and swollen.

## 2022-10-06 DIAGNOSIS — Z419 Encounter for procedure for purposes other than remedying health state, unspecified: Secondary | ICD-10-CM | POA: Diagnosis not present

## 2022-10-10 DIAGNOSIS — F17210 Nicotine dependence, cigarettes, uncomplicated: Secondary | ICD-10-CM | POA: Diagnosis not present

## 2022-10-10 DIAGNOSIS — E669 Obesity, unspecified: Secondary | ICD-10-CM | POA: Diagnosis not present

## 2022-10-10 DIAGNOSIS — E782 Mixed hyperlipidemia: Secondary | ICD-10-CM | POA: Diagnosis not present

## 2022-10-10 DIAGNOSIS — Z6835 Body mass index (BMI) 35.0-35.9, adult: Secondary | ICD-10-CM | POA: Diagnosis not present

## 2022-10-10 DIAGNOSIS — E049 Nontoxic goiter, unspecified: Secondary | ICD-10-CM | POA: Diagnosis not present

## 2022-10-10 DIAGNOSIS — R7303 Prediabetes: Secondary | ICD-10-CM | POA: Diagnosis not present

## 2022-11-06 DIAGNOSIS — Z419 Encounter for procedure for purposes other than remedying health state, unspecified: Secondary | ICD-10-CM | POA: Diagnosis not present

## 2022-11-09 DIAGNOSIS — E7800 Pure hypercholesterolemia, unspecified: Secondary | ICD-10-CM | POA: Insufficient documentation

## 2022-12-07 DIAGNOSIS — Z419 Encounter for procedure for purposes other than remedying health state, unspecified: Secondary | ICD-10-CM | POA: Diagnosis not present

## 2022-12-23 ENCOUNTER — Emergency Department (HOSPITAL_BASED_OUTPATIENT_CLINIC_OR_DEPARTMENT_OTHER): Payer: Commercial Managed Care - HMO

## 2022-12-23 ENCOUNTER — Encounter: Payer: Self-pay | Admitting: Emergency Medicine

## 2022-12-23 ENCOUNTER — Encounter (HOSPITAL_BASED_OUTPATIENT_CLINIC_OR_DEPARTMENT_OTHER): Payer: Self-pay

## 2022-12-23 ENCOUNTER — Emergency Department (HOSPITAL_BASED_OUTPATIENT_CLINIC_OR_DEPARTMENT_OTHER)
Admission: EM | Admit: 2022-12-23 | Discharge: 2022-12-23 | Disposition: A | Discharge status: Home / Self Care | Payer: Commercial Managed Care - HMO | Attending: Emergency Medicine | Admitting: Emergency Medicine

## 2022-12-23 ENCOUNTER — Other Ambulatory Visit: Payer: Self-pay

## 2022-12-23 ENCOUNTER — Ambulatory Visit
Admission: EM | Admit: 2022-12-23 | Discharge: 2022-12-23 | Disposition: A | Discharge status: Home / Self Care | Payer: Commercial Managed Care - HMO

## 2022-12-23 DIAGNOSIS — S8392XA Sprain of unspecified site of left knee, initial encounter: Secondary | ICD-10-CM | POA: Diagnosis not present

## 2022-12-23 DIAGNOSIS — M25562 Pain in left knee: Secondary | ICD-10-CM

## 2022-12-23 DIAGNOSIS — X501XXA Overexertion from prolonged static or awkward postures, initial encounter: Secondary | ICD-10-CM | POA: Diagnosis not present

## 2022-12-23 DIAGNOSIS — S8992XA Unspecified injury of left lower leg, initial encounter: Secondary | ICD-10-CM | POA: Diagnosis present

## 2022-12-23 DIAGNOSIS — Y990 Civilian activity done for income or pay: Secondary | ICD-10-CM | POA: Diagnosis not present

## 2022-12-23 MED ORDER — IBUPROFEN 800 MG PO TABS
800.0000 mg | ORAL_TABLET | Freq: Once | ORAL | Status: AC
Start: 2022-12-23 — End: 2022-12-23
  Administered 2022-12-23: 800 mg via ORAL
  Filled 2022-12-23: qty 1

## 2022-12-23 NOTE — ED Triage Notes (Signed)
Correction left knee

## 2022-12-23 NOTE — ED Triage Notes (Signed)
In for eval of right knee pain. Got out of [employer] yesteerday at work, felt a pop. Pain worsening. Took ibuprofen last dose 0700 this am. Took tylenol and ibuprofen without relief. Was seen by clinic and was sent to othro but forgot her insurance card so was not seen today.

## 2022-12-23 NOTE — ED Triage Notes (Signed)
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

## 2022-12-23 NOTE — Discharge Instructions (Signed)
Recommend knee sleeve for comfort, can remove for bathing and sleep.  Use crutches, weight-bear as tolerated.  Can take Motrin and Tylenol as needed as directed.  Apply ice to the knee and elevate for 20 minutes at a time.  Follow-up with your Worker's Comp. provider for recheck.

## 2022-12-23 NOTE — ED Provider Notes (Signed)
[facility] Provider Note   CSN: [id number] Arrival date & time: 12/23/22  1023     History  Chief Complaint  Patient presents with   Knee Pain    Suzanne Myers is a 42 y.o. female.  [name] with complaint of left knee pain.  Patient states that she was getting out of her work [employer] yesterday when she felt a pop in her left knee.  Did not experience significant or disabling pain at that time however progressively developed worsening pain in the posterior left knee.  Took Motrin Tylenol this morning without relief and was unable to go to work so she went to urgent care.  Patient forgot her insurance card and was unable to be seen so she presented to the emergency room.  No prior injuries to this knee.  No other injuries, complaints, concerns.       Home Medications Prior to Admission medications   Medication Sig Start Date End Date Taking? Authorizing Provider  ibuprofen (ADVIL) 600 MG tablet Take 1 tablet (600 mg total) by mouth every 6 (six) hours as needed for mild pain or moderate pain. 08/13/21  Yes M[place], Hildred Alamin E, FNP  rosuvastatin (CRESTOR) 5 MG tablet Take 5 mg by mouth daily. 01/25/22  Yes [provider]  benzonatate (TESSALON) 100 MG capsule Take 1 capsule (100 mg total) by mouth every 8 (eight) hours. 09/10/18   Nuala Alpha A, PA-C  cyclobenzaprine (FLEXERIL) 5 MG tablet Take 1 tablet (5 mg total) by mouth 2 (two) times daily as needed for muscle spasms. 08/13/21   Teodora Medici, FNP  diphenhydrAMINE (BENADRYL) 25 MG tablet Take 1 tablet (25 mg total) by mouth every 6 (six) hours as needed. 02/28/21   Lajean Saver, MD  meloxicam (MOBIC) 15 MG tablet Take 1 tablet (15 mg total) by mouth daily. 06/17/19   Cuthriell, Charline Bills, PA-C  methocarbamol (ROBAXIN) 500 MG tablet Take 1 tablet (500 mg total) by mouth 4 (four) times daily. 06/17/19   Cuthriell, Charline Bills, PA-C  oxyCODONE-acetaminophen  (PERCOCET/ROXICET) 5-325 MG tablet Take 1 tablet by mouth every 6 (six) hours as needed for severe pain. 06/17/19   Cuthriell, Charline Bills, PA-C  phentermine 37.5 MG capsule Take 37.5 mg by mouth daily. 08/05/21   [provider]  predniSONE (DELTASONE) 20 MG tablet 3 po once a day for 2 days, then 2 po once a day for 3 days, then 1 po once a day for 3 days 03/01/21   Lajean Saver, MD  topiramate (TOPAMAX) 25 MG tablet Take 25 mg by mouth 2 (two) times daily. 01/25/22   [provider]      Allergies    Codeine    Review of Systems   Review of Systems Negative except as per HPI Physical Exam Updated Vital Signs BP (!) 121/102 (BP Location: Left Arm)   Pulse 60   Temp 98.3 F (36.8 C) (Oral)   Resp 18   Ht 5' 4"$  (1.626 m)   Wt 88.9 kg   SpO2 96%   BMI 33.64 kg/m  Physical Exam Vitals and nursing note reviewed.  Constitutional:      General: She is not in acute distress.    Appearance: She is well-developed. She is not diaphoretic.  HENT:     Head: Normocephalic and atraumatic.  Cardiovascular:     Pulses: Normal pulses.  Pulmonary:     Effort: Pulmonary effort is normal.  Musculoskeletal:  General: Swelling and tenderness present. No deformity.     Left hip: Normal.     Left knee: Swelling present. No deformity, effusion, erythema, ecchymosis, lacerations, bony tenderness or crepitus. Normal range of motion. Tenderness present. No medial joint line, lateral joint line or patellar tendon tenderness. Normal pulse.     Right lower leg: No edema.     Left lower leg: No edema.     Left ankle: Normal.       Legs:  Skin:    General: Skin is warm and dry.     Findings: No erythema or rash.  Neurological:     Mental Status: She is alert and oriented to person, place, and time.     Sensory: No sensory deficit.     Motor: No weakness.     Gait: Gait abnormal.  Psychiatric:        Behavior: Behavior normal.     ED Results / Procedures / Treatments    Labs (all labs ordered are listed, but only abnormal results are displayed) Labs Reviewed - No data to display  EKG None  Radiology DG Knee 2 Views Left  Result Date: 12/23/2022 CLINICAL DATA:  Patient reports knee pain after twisting knee yesterday. Patient states pain radiates to ankle. EXAM: LEFT KNEE - 1-2 VIEW COMPARISON:  None Available. FINDINGS: No evidence of fracture, dislocation, or joint effusion. Mild joint space narrowing of the medial compartment. No focal bone abnormality. Soft tissues are unremarkable. IMPRESSION: No acute abnormality of the left knee. Electronically Signed   By: Ileana Roup M.D.   On: 12/23/2022 12:02    Procedures Procedures    Medications Ordered in ED Medications  ibuprofen (ADVIL) tablet 800 mg (has no administration in time range)    ED Course/ Medical Decision Making/ A&P                             Medical Decision Making Amount and/or Complexity of Data Reviewed Radiology: ordered.  Risk Prescription drug management.   42 year old female with complaint of posterior left knee pain after stepping out of work [employer] yesterday and feeling a pop in her knee.  Found to have tenderness to the posterior knee with mild swelling of the area, no appreciable laxity although limited exam secondary to pain.  No crepitus, no joint line tenderness.  X-ray of the left knee as ordered interpreted myself is negative for acute bony abnormality.  Plan is to place in a knee sleeve and provided with crutches.  Advised to recheck with her Worker's Comp. provider.        Final Clinical Impression(s) / ED Diagnoses Final diagnoses:  Sprain of left knee, unspecified ligament, initial encounter    Rx / DC Orders ED Discharge Orders     None         Tacy Learn, PA-C 12/23/22 1303    Fredia Sorrow, MD 12/27/22 [phone]

## 2022-12-23 NOTE — ED Provider Notes (Signed)
Patient presents with knee injury that occurred yesterday at work- concern for possible soft tissue damage given twisting mechanism- recommended further evaluation by [facility] urgent care as I suspect she may need more advanced imaging. Patient is agreeable to same.    Francene Finders, PA-C 12/23/22 [phone]

## 2023-01-05 DIAGNOSIS — Z419 Encounter for procedure for purposes other than remedying health state, unspecified: Secondary | ICD-10-CM | POA: Diagnosis not present

## 2023-02-05 DIAGNOSIS — Z419 Encounter for procedure for purposes other than remedying health state, unspecified: Secondary | ICD-10-CM | POA: Diagnosis not present

## 2023-03-07 DIAGNOSIS — Z419 Encounter for procedure for purposes other than remedying health state, unspecified: Secondary | ICD-10-CM | POA: Diagnosis not present

## 2023-03-20 IMAGING — CR DG FOREARM 2V*L*
2 series · 2 of 2 positions shown · non-contrast
Comparison: None.

CLINICAL DATA: Laceration with glass left forearm.

EXAM:
LEFT FOREARM - 2 VIEW

[forearm ap]
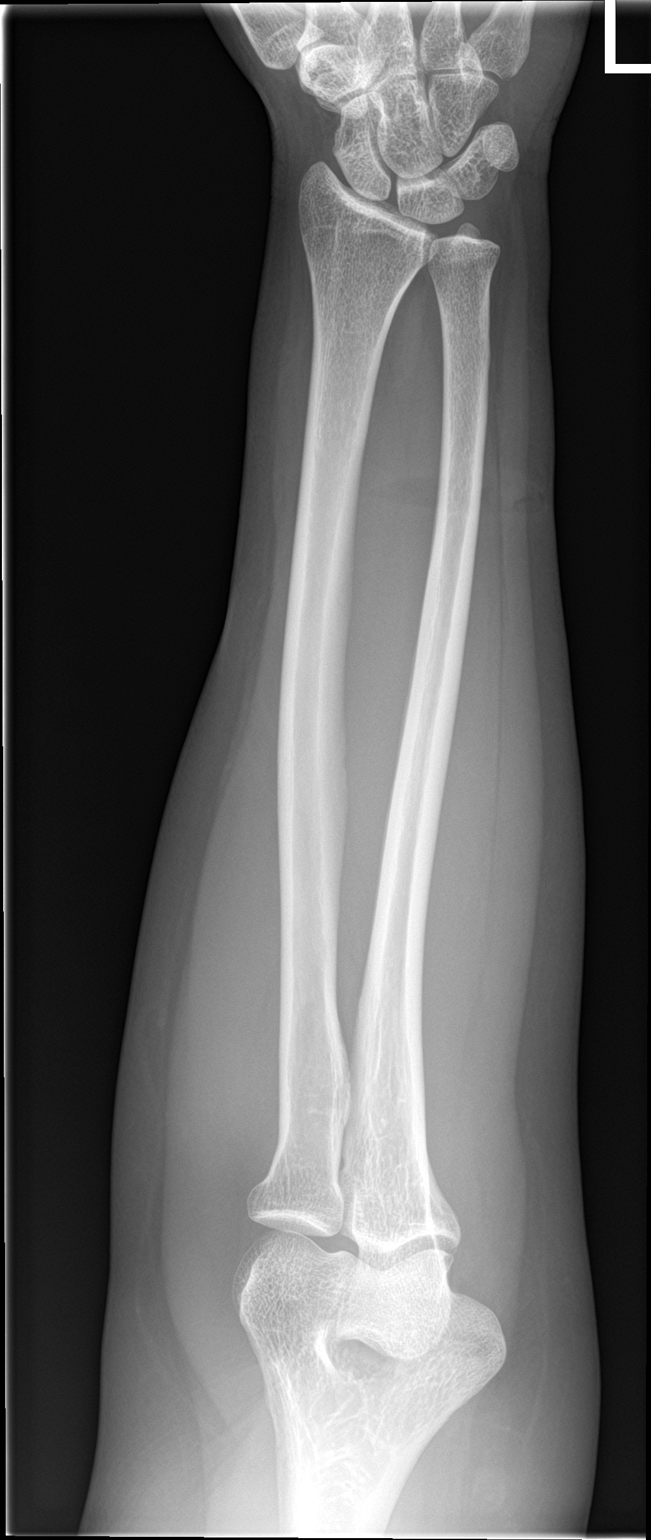

[forearm lat]
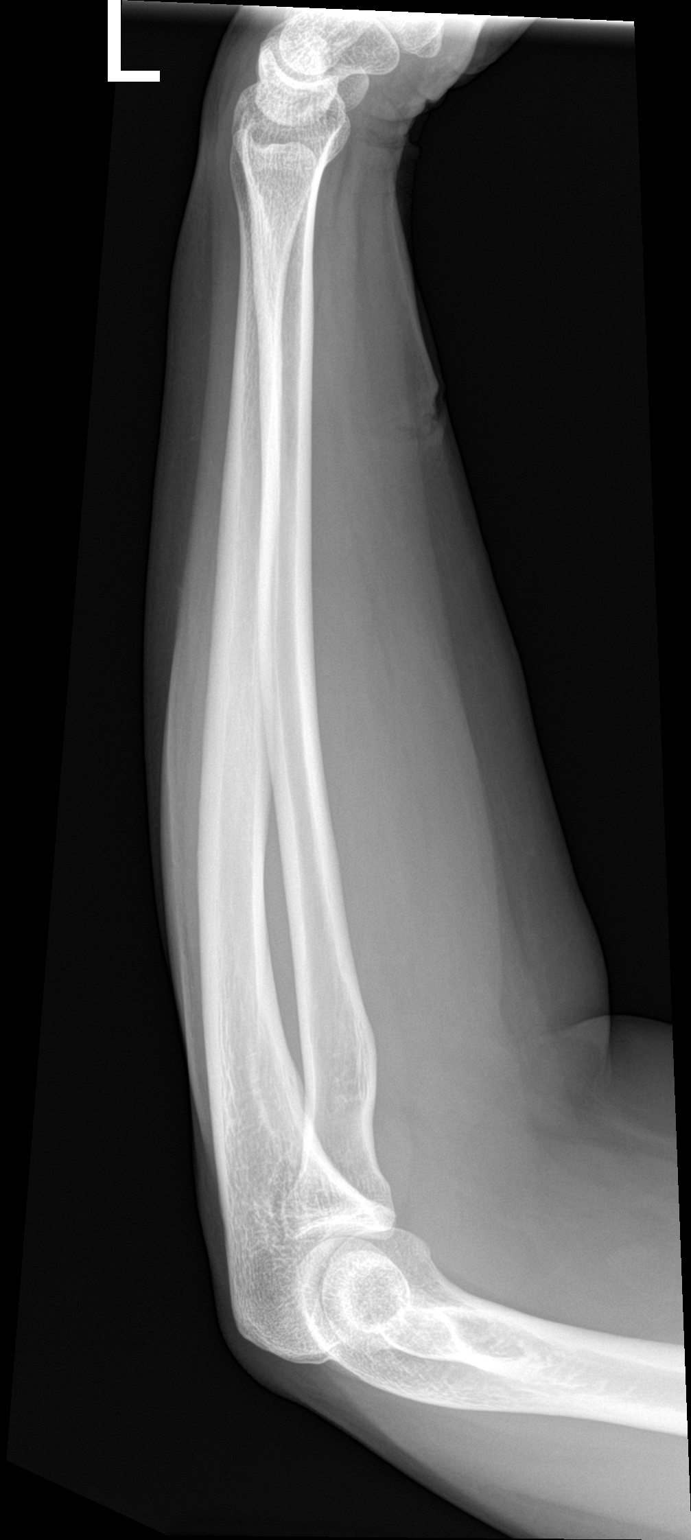

[2 of 2 positions shown; findings below may reference images not displayed]

FINDINGS: Evidence of patient's soft tissue laceration over the volar aspect
of the distal forearm. No evidence of radiopaque foreign body.
Underlying bony structures are normal.
IMPRESSION: No acute fracture or radiopaque foreign body.

## 2023-04-07 DIAGNOSIS — Z419 Encounter for procedure for purposes other than remedying health state, unspecified: Secondary | ICD-10-CM | POA: Diagnosis not present

## 2023-04-17 DIAGNOSIS — Z1339 Encounter for screening examination for other mental health and behavioral disorders: Secondary | ICD-10-CM | POA: Diagnosis not present

## 2023-04-17 DIAGNOSIS — N926 Irregular menstruation, unspecified: Secondary | ICD-10-CM | POA: Diagnosis not present

## 2023-04-17 DIAGNOSIS — R42 Dizziness and giddiness: Secondary | ICD-10-CM | POA: Diagnosis not present

## 2023-04-17 DIAGNOSIS — E782 Mixed hyperlipidemia: Secondary | ICD-10-CM | POA: Diagnosis not present

## 2023-04-17 DIAGNOSIS — Z1331 Encounter for screening for depression: Secondary | ICD-10-CM | POA: Diagnosis not present

## 2023-04-17 DIAGNOSIS — Z6836 Body mass index (BMI) 36.0-36.9, adult: Secondary | ICD-10-CM | POA: Diagnosis not present

## 2023-05-07 DIAGNOSIS — Z419 Encounter for procedure for purposes other than remedying health state, unspecified: Secondary | ICD-10-CM | POA: Diagnosis not present

## 2023-06-07 DIAGNOSIS — Z419 Encounter for procedure for purposes other than remedying health state, unspecified: Secondary | ICD-10-CM | POA: Diagnosis not present

## 2023-06-29 DIAGNOSIS — F419 Anxiety disorder, unspecified: Secondary | ICD-10-CM | POA: Diagnosis not present

## 2023-06-29 DIAGNOSIS — E785 Hyperlipidemia, unspecified: Secondary | ICD-10-CM | POA: Diagnosis not present

## 2023-06-29 DIAGNOSIS — Z1331 Encounter for screening for depression: Secondary | ICD-10-CM | POA: Diagnosis not present

## 2023-06-29 DIAGNOSIS — F319 Bipolar disorder, unspecified: Secondary | ICD-10-CM | POA: Diagnosis not present

## 2023-06-29 DIAGNOSIS — Z136 Encounter for screening for cardiovascular disorders: Secondary | ICD-10-CM | POA: Diagnosis not present

## 2023-06-29 DIAGNOSIS — R7303 Prediabetes: Secondary | ICD-10-CM | POA: Diagnosis not present

## 2023-06-29 DIAGNOSIS — F32A Depression, unspecified: Secondary | ICD-10-CM | POA: Diagnosis not present

## 2023-06-29 DIAGNOSIS — Z1339 Encounter for screening examination for other mental health and behavioral disorders: Secondary | ICD-10-CM | POA: Diagnosis not present

## 2023-06-29 DIAGNOSIS — E669 Obesity, unspecified: Secondary | ICD-10-CM | POA: Diagnosis not present

## 2023-07-06 DIAGNOSIS — D72829 Elevated white blood cell count, unspecified: Secondary | ICD-10-CM | POA: Diagnosis not present

## 2023-07-08 DIAGNOSIS — Z419 Encounter for procedure for purposes other than remedying health state, unspecified: Secondary | ICD-10-CM | POA: Diagnosis not present

## 2023-07-11 ENCOUNTER — Telehealth: Payer: Medicaid Other | Admitting: Physician Assistant

## 2023-07-11 DIAGNOSIS — H66001 Acute suppurative otitis media without spontaneous rupture of ear drum, right ear: Secondary | ICD-10-CM | POA: Diagnosis not present

## 2023-07-11 MED ORDER — AZITHROMYCIN 250 MG PO TABS: ORAL_TABLET | ORAL | 0 refills | Status: AC

## 2023-07-11 NOTE — Progress Notes (Signed)
I have spent 5 minutes in review of e-visit questionnaire, review and updating patient chart, medical decision making and response to patient.   William Cody Martin, PA-C    

## 2023-07-11 NOTE — Progress Notes (Signed)
E-Visit for Ear Pain - Acute Otitis Media   We are sorry that you are not feeling well. Here is how we plan to help!  Based on what you have shared with me it looks like you have Acute Otitis Media.  Acute Otitis Media is an infection of the middle or "inner" ear. This type of infection can cause redness, inflammation, and fluid buildup behind the tympanic membrane (ear drum).  The usual symptoms include: Earache/Pain Fever Upper respiratory symptoms Lack of energy/Fatigue/Malaise Slight hearing loss gradually worsening- if the inner ear fills with fluid What causes middle ear infections? Most middle ear infections occur when an infection such as a cold, leads to a build-up of mucus in the middle ear and causes the Eustachian tube (a thin tube that runs from the middle ear to the back of the nose) to become swollen or blocked.   This means mucus can't drain away properly, making it easier for an infection to spread into the middle ear.  How middle ear infections are treated: Most ear infections clear up within three to five days and don't need any specific treatment. If necessary, tylenol or ibuprofen should be used to relieve pain and a high temperature.  If you develop a fever higher than 102, or any significantly worsening symptoms, this could indicate a more serious infection moving to the middle/inner and needs face to face evaluation in an office by a provider.   Antibiotics aren't routinely used to treat middle ear infections, although they may occasionally be prescribed if symptoms persist or are particularly severe. Given your presentation,   I have prescribed Azithromycin 250 mg two tablets by mouth on day 1, then 1 tablet by mouth daily until completed Start OTC Flonase nasal spray once daily along with a daily Claritin or Zyrtec.   Your symptoms should improve over the next 3 days and should resolve in about 7 days. Be sure to complete ALL of the prescription(s) given.  HOME  CARE: Wash your hands frequently. If you are prescribed an ear drop, do not place the tip of the bottle on your ear or touch it with your fingers. You can take Acetaminophen 650 mg every 4-6 hours as needed for pain.  If pain is severe or moderate, you can apply a heating pad (set on low) or hot water bottle (wrapped in a towel) to outer ear for 20 minutes.  This will also increase drainage.  GET HELP RIGHT AWAY IF: Fever is over 102.2 degrees. You develop progressive ear pain or hearing loss. Ear symptoms persist longer than 3 days after treatment.  MAKE SURE YOU: Understand these instructions. Will watch your condition. Will get help right away if you are not doing well or get worse.  Thank you for choosing an e-visit.  Your e-visit answers were reviewed by a board certified advanced clinical practitioner to complete your personal care plan. Depending upon the condition, your plan could have included both over the counter or prescription medications.  Please review your pharmacy choice. Make sure the pharmacy is open so you can pick up the prescription now. If there is a problem, you may contact your provider through [employer] and have the prescription routed to another pharmacy.  Your safety is important to [place]. If you have drug allergies check your prescription carefully.   For the next 24 hours you can use MyChart to ask questions about today's visit, request a non-urgent call back, or ask for a work or school excuse.  You will get an email with a survey after your eVisit asking about your experience. We would appreciate your feedback. I hope that your e-visit has been valuable and will aid in your recovery.

## 2023-07-25 DIAGNOSIS — D72829 Elevated white blood cell count, unspecified: Secondary | ICD-10-CM | POA: Diagnosis not present

## 2023-07-25 DIAGNOSIS — F419 Anxiety disorder, unspecified: Secondary | ICD-10-CM | POA: Diagnosis not present

## 2023-07-25 DIAGNOSIS — E669 Obesity, unspecified: Secondary | ICD-10-CM | POA: Diagnosis not present

## 2023-07-25 DIAGNOSIS — Z713 Dietary counseling and surveillance: Secondary | ICD-10-CM | POA: Diagnosis not present

## 2023-07-25 DIAGNOSIS — F319 Bipolar disorder, unspecified: Secondary | ICD-10-CM | POA: Diagnosis not present

## 2023-07-25 DIAGNOSIS — F32A Depression, unspecified: Secondary | ICD-10-CM | POA: Diagnosis not present

## 2023-07-25 DIAGNOSIS — E785 Hyperlipidemia, unspecified: Secondary | ICD-10-CM | POA: Diagnosis not present

## 2023-08-07 DIAGNOSIS — Z419 Encounter for procedure for purposes other than remedying health state, unspecified: Secondary | ICD-10-CM | POA: Diagnosis not present

## 2023-08-31 DIAGNOSIS — E785 Hyperlipidemia, unspecified: Secondary | ICD-10-CM | POA: Diagnosis not present

## 2023-08-31 DIAGNOSIS — E669 Obesity, unspecified: Secondary | ICD-10-CM | POA: Diagnosis not present

## 2023-08-31 DIAGNOSIS — R7303 Prediabetes: Secondary | ICD-10-CM | POA: Diagnosis not present

## 2023-08-31 DIAGNOSIS — K219 Gastro-esophageal reflux disease without esophagitis: Secondary | ICD-10-CM | POA: Diagnosis not present

## 2023-08-31 DIAGNOSIS — F32A Depression, unspecified: Secondary | ICD-10-CM | POA: Diagnosis not present

## 2023-08-31 DIAGNOSIS — F419 Anxiety disorder, unspecified: Secondary | ICD-10-CM | POA: Diagnosis not present

## 2023-08-31 DIAGNOSIS — Z23 Encounter for immunization: Secondary | ICD-10-CM | POA: Diagnosis not present

## 2023-08-31 DIAGNOSIS — G4700 Insomnia, unspecified: Secondary | ICD-10-CM | POA: Diagnosis not present

## 2023-08-31 DIAGNOSIS — E559 Vitamin D deficiency, unspecified: Secondary | ICD-10-CM | POA: Diagnosis not present

## 2023-09-07 DIAGNOSIS — R7303 Prediabetes: Secondary | ICD-10-CM | POA: Diagnosis not present

## 2023-09-07 DIAGNOSIS — R070 Pain in throat: Secondary | ICD-10-CM | POA: Diagnosis not present

## 2023-09-07 DIAGNOSIS — Z20828 Contact with and (suspected) exposure to other viral communicable diseases: Secondary | ICD-10-CM | POA: Diagnosis not present

## 2023-09-07 DIAGNOSIS — Z1159 Encounter for screening for other viral diseases: Secondary | ICD-10-CM | POA: Diagnosis not present

## 2023-09-07 DIAGNOSIS — Z419 Encounter for procedure for purposes other than remedying health state, unspecified: Secondary | ICD-10-CM | POA: Diagnosis not present

## 2023-09-07 DIAGNOSIS — Z112 Encounter for screening for other bacterial diseases: Secondary | ICD-10-CM | POA: Diagnosis not present

## 2023-09-07 DIAGNOSIS — J069 Acute upper respiratory infection, unspecified: Secondary | ICD-10-CM | POA: Diagnosis not present

## 2023-09-07 DIAGNOSIS — R062 Wheezing: Secondary | ICD-10-CM | POA: Diagnosis not present

## 2023-09-07 DIAGNOSIS — R059 Cough, unspecified: Secondary | ICD-10-CM | POA: Diagnosis not present

## 2023-09-10 DIAGNOSIS — R0602 Shortness of breath: Secondary | ICD-10-CM | POA: Diagnosis not present

## 2023-09-10 DIAGNOSIS — Z20828 Contact with and (suspected) exposure to other viral communicable diseases: Secondary | ICD-10-CM | POA: Diagnosis not present

## 2023-09-10 DIAGNOSIS — Z1159 Encounter for screening for other viral diseases: Secondary | ICD-10-CM | POA: Diagnosis not present

## 2023-09-10 DIAGNOSIS — R059 Cough, unspecified: Secondary | ICD-10-CM | POA: Diagnosis not present

## 2023-09-10 DIAGNOSIS — Z112 Encounter for screening for other bacterial diseases: Secondary | ICD-10-CM | POA: Diagnosis not present

## 2023-10-07 DIAGNOSIS — Z419 Encounter for procedure for purposes other than remedying health state, unspecified: Secondary | ICD-10-CM | POA: Diagnosis not present

## 2023-10-12 DIAGNOSIS — E785 Hyperlipidemia, unspecified: Secondary | ICD-10-CM | POA: Diagnosis not present

## 2023-10-12 DIAGNOSIS — E559 Vitamin D deficiency, unspecified: Secondary | ICD-10-CM | POA: Diagnosis not present

## 2023-10-12 DIAGNOSIS — F419 Anxiety disorder, unspecified: Secondary | ICD-10-CM | POA: Diagnosis not present

## 2023-10-12 DIAGNOSIS — D519 Vitamin B12 deficiency anemia, unspecified: Secondary | ICD-10-CM | POA: Diagnosis not present

## 2023-10-12 DIAGNOSIS — K219 Gastro-esophageal reflux disease without esophagitis: Secondary | ICD-10-CM | POA: Diagnosis not present

## 2023-11-01 ENCOUNTER — Telehealth: Payer: BC Managed Care – PPO | Admitting: Physician Assistant

## 2023-11-01 ENCOUNTER — Telehealth: Payer: Medicaid Other | Admitting: Physician Assistant

## 2023-11-01 DIAGNOSIS — H6501 Acute serous otitis media, right ear: Secondary | ICD-10-CM

## 2023-11-01 DIAGNOSIS — A084 Viral intestinal infection, unspecified: Secondary | ICD-10-CM | POA: Diagnosis not present

## 2023-11-01 MED ORDER — AMOXICILLIN-POT CLAVULANATE 875-125 MG PO TABS: 1.0000 | ORAL_TABLET | Freq: Two times a day (BID) | ORAL | 0 refills | Status: DC

## 2023-11-01 MED ORDER — CIPROFLOXACIN-DEXAMETHASONE 0.3-0.1 % OT SUSP: 4.0000 [drp] | Freq: Two times a day (BID) | OTIC | 0 refills | Status: DC

## 2023-11-01 MED ORDER — ONDANSETRON 4 MG PO TBDP: 4.0000 mg | ORAL_TABLET | Freq: Three times a day (TID) | ORAL | 0 refills | Status: DC | PRN

## 2023-11-01 NOTE — Progress Notes (Signed)

## 2023-11-01 NOTE — Progress Notes (Signed)
E-Visit for Diarrhea  We are sorry that you are not feeling well.  Here is how we plan to help!  Based on what you have shared with me it looks like you have Acute Infectious Diarrhea.  Most cases of acute diarrhea are due to infections with virus and bacteria and are self-limited conditions lasting less than 14 days.  For your symptoms you may take Imodium 2 mg tablets that are over the counter at your local pharmacy. Take two tablet now and then one after each loose stool up to 6 a day.  Antibiotics are not needed for most people with diarrhea.  Optional: Zofran 4 mg 1 tablet every 8 hours as needed for nausea and vomiting   HOME CARE We recommend changing your diet to help with your symptoms for the next few days. Drink plenty of fluids that contain water salt and sugar. Sports drinks such as Gatorade may help.  You may try broths, soups, bananas, applesauce, soft breads, mashed potatoes or crackers.  You are considered infectious for as long as the diarrhea continues. Hand washing or use of alcohol based hand sanitizers is recommend. It is best to stay out of work or school until your symptoms stop.   GET HELP RIGHT AWAY If you have dark yellow colored urine or do not pass urine frequently you should drink more fluids.   If your symptoms worsen  If you feel like you are going to pass out (faint) You have a new problem  MAKE SURE YOU  Understand these instructions. Will watch your condition. Will get help right away if you are not doing well or get worse.  Thank you for choosing an e-visit.  Your e-visit answers were reviewed by a board certified advanced clinical practitioner to complete your personal care plan. Depending upon the condition, your plan could have included both over the counter or prescription medications.  Please review your pharmacy choice. Make sure the pharmacy is open so you can pick up prescription now. If there is a problem, you may contact your provider  through MyChart messaging and have the prescription routed to another pharmacy.  Your safety is important to us. If you have drug allergies check your prescription carefully.   For the next 24 hours you can use MyChart to ask questions about today's visit, request a non-urgent call back, or ask for a work or school excuse. You will get an email in the next two days asking about your experience. I hope that your e-visit has been valuable and will speed your recovery.  I have spent 5 minutes in review of e-visit questionnaire, review and updating patient chart, medical decision making and response to patient.   Billi Bright M Burhan Barham, PA-C  

## 2023-11-07 DIAGNOSIS — Z419 Encounter for procedure for purposes other than remedying health state, unspecified: Secondary | ICD-10-CM | POA: Diagnosis not present

## 2023-11-20 ENCOUNTER — Telehealth: Payer: BC Managed Care – PPO | Admitting: Nurse Practitioner

## 2023-11-20 DIAGNOSIS — R112 Nausea with vomiting, unspecified: Secondary | ICD-10-CM | POA: Diagnosis not present

## 2023-11-20 MED ORDER — PROMETHAZINE HCL 25 MG PO TABS: 25.0000 mg | ORAL_TABLET | Freq: Two times a day (BID) | ORAL | 0 refills | Status: DC | PRN

## 2023-11-20 NOTE — Progress Notes (Signed)
 E-Visit for Nausea and Vomiting   We are sorry that you are not feeling well. Here is how we plan to help!  Based on what you have shared with me it looks like you have a Virus that is irritating your GI tract.  Vomiting is the forceful emptying of a portion of the stomach's content through the mouth.  Although nausea and vomiting can make you feel miserable, it's important to remember that these are not diseases, but rather symptoms of an underlying illness.  When we treat short term symptoms, we always caution that any symptoms that persist should be fully evaluated in a medical office.  I have prescribed a medication that will help alleviate your symptoms and allow you to stay hydrated:  Promethazine  25 mg take 1 tablet twice daily as needed for nausea We would not recommend anti-diarrheal medication until you have started to tolerated fluids and are rehydrated  Typically anti diarrhea medications might make you vomit more while your body is trying to rid itself of the virus or spoiled food.    HOME CARE: Drink clear liquids.  This is very important! Dehydration (the lack of fluid) can lead to a serious complication.  Start off with 1 tablespoon every 5 minutes for 8 hours. You may begin eating bland foods after 8 hours without vomiting.  Start with saltine crackers, white bread, rice, mashed potatoes, applesauce. After 48 hours on a bland diet, you may resume a normal diet. Try to go to sleep.  Sleep often empties the stomach and relieves the need to vomit.  GET HELP RIGHT AWAY IF:  Your symptoms do not improve or worsen within 2 days after treatment. You have a fever for over 3 days. You cannot keep down fluids after trying the medication.  MAKE SURE YOU:  Understand these instructions. Will watch your condition. Will get help right away if you are not doing well or get worse.    Thank you for choosing an e-visit.  Your e-visit answers were reviewed by a board certified  advanced clinical practitioner to complete your personal care plan. Depending upon the condition, your plan could have included both over the counter or prescription medications.  Please review your pharmacy choice. Make sure the pharmacy is open so you can pick up prescription now. If there is a problem, you may contact your provider through [employer] and have the prescription routed to another pharmacy.  Your safety is important to us . If you have drug allergies check your prescription carefully.   For the next 24 hours you can use MyChart to ask questions about today's visit, request a non-urgent call back, or ask for a work or school excuse. You will get an email in the next two days asking about your experience. I hope that your e-visit has been valuable and will speed your recovery.   I spent approximately 5 minutes reviewing the patient's history, current symptoms and coordinating their care today.

## 2023-11-21 ENCOUNTER — Telehealth: Payer: Medicaid Other | Admitting: Physician Assistant

## 2023-11-21 DIAGNOSIS — A084 Viral intestinal infection, unspecified: Secondary | ICD-10-CM

## 2023-11-21 MED ORDER — PROMETHAZINE HCL 25 MG RE SUPP: 25.0000 mg | Freq: Three times a day (TID) | RECTAL | 0 refills | Status: DC | PRN

## 2023-11-21 NOTE — Progress Notes (Signed)

## 2023-12-04 DIAGNOSIS — Z713 Dietary counseling and surveillance: Secondary | ICD-10-CM | POA: Diagnosis not present

## 2023-12-04 DIAGNOSIS — D72829 Elevated white blood cell count, unspecified: Secondary | ICD-10-CM | POA: Diagnosis not present

## 2023-12-04 DIAGNOSIS — F32A Depression, unspecified: Secondary | ICD-10-CM | POA: Diagnosis not present

## 2023-12-04 DIAGNOSIS — F319 Bipolar disorder, unspecified: Secondary | ICD-10-CM | POA: Diagnosis not present

## 2023-12-04 DIAGNOSIS — K219 Gastro-esophageal reflux disease without esophagitis: Secondary | ICD-10-CM | POA: Diagnosis not present

## 2023-12-04 DIAGNOSIS — E785 Hyperlipidemia, unspecified: Secondary | ICD-10-CM | POA: Diagnosis not present

## 2023-12-04 DIAGNOSIS — F419 Anxiety disorder, unspecified: Secondary | ICD-10-CM | POA: Diagnosis not present

## 2023-12-08 DIAGNOSIS — Z419 Encounter for procedure for purposes other than remedying health state, unspecified: Secondary | ICD-10-CM | POA: Diagnosis not present

## 2024-01-05 DIAGNOSIS — Z419 Encounter for procedure for purposes other than remedying health state, unspecified: Secondary | ICD-10-CM | POA: Diagnosis not present

## 2024-01-21 ENCOUNTER — Telehealth: Admitting: Physician Assistant

## 2024-01-21 DIAGNOSIS — R6889 Other general symptoms and signs: Secondary | ICD-10-CM | POA: Diagnosis not present

## 2024-01-22 MED ORDER — OSELTAMIVIR PHOSPHATE 75 MG PO CAPS: 75.0000 mg | ORAL_CAPSULE | Freq: Two times a day (BID) | ORAL | 0 refills | Status: DC

## 2024-01-22 NOTE — Progress Notes (Signed)
 I have spent 5 minutes in review of e-visit questionnaire, review and updating patient chart, medical decision making and response to patient.   Piedad Climes, PA-C

## 2024-01-22 NOTE — Progress Notes (Signed)

## 2024-02-16 DIAGNOSIS — Z419 Encounter for procedure for purposes other than remedying health state, unspecified: Secondary | ICD-10-CM | POA: Diagnosis not present

## 2024-02-29 DIAGNOSIS — R519 Headache, unspecified: Secondary | ICD-10-CM | POA: Diagnosis not present

## 2024-02-29 DIAGNOSIS — Z20828 Contact with and (suspected) exposure to other viral communicable diseases: Secondary | ICD-10-CM | POA: Diagnosis not present

## 2024-02-29 DIAGNOSIS — Z1159 Encounter for screening for other viral diseases: Secondary | ICD-10-CM | POA: Diagnosis not present

## 2024-02-29 DIAGNOSIS — J069 Acute upper respiratory infection, unspecified: Secondary | ICD-10-CM | POA: Diagnosis not present

## 2024-03-07 DIAGNOSIS — F319 Bipolar disorder, unspecified: Secondary | ICD-10-CM | POA: Diagnosis not present

## 2024-03-07 DIAGNOSIS — F419 Anxiety disorder, unspecified: Secondary | ICD-10-CM | POA: Diagnosis not present

## 2024-03-07 DIAGNOSIS — E559 Vitamin D deficiency, unspecified: Secondary | ICD-10-CM | POA: Diagnosis not present

## 2024-03-07 DIAGNOSIS — Z713 Dietary counseling and surveillance: Secondary | ICD-10-CM | POA: Diagnosis not present

## 2024-03-07 DIAGNOSIS — E785 Hyperlipidemia, unspecified: Secondary | ICD-10-CM | POA: Diagnosis not present

## 2024-03-07 DIAGNOSIS — E669 Obesity, unspecified: Secondary | ICD-10-CM | POA: Diagnosis not present

## 2024-03-07 DIAGNOSIS — F32A Depression, unspecified: Secondary | ICD-10-CM | POA: Diagnosis not present

## 2024-03-17 DIAGNOSIS — Z419 Encounter for procedure for purposes other than remedying health state, unspecified: Secondary | ICD-10-CM | POA: Diagnosis not present

## 2024-04-15 ENCOUNTER — Other Ambulatory Visit: Payer: Self-pay | Admitting: Nurse Practitioner

## 2024-04-15 DIAGNOSIS — Z1231 Encounter for screening mammogram for malignant neoplasm of breast: Secondary | ICD-10-CM

## 2024-04-17 ENCOUNTER — Telehealth: Admitting: Physician Assistant

## 2024-04-17 DIAGNOSIS — J069 Acute upper respiratory infection, unspecified: Secondary | ICD-10-CM

## 2024-04-17 DIAGNOSIS — Z419 Encounter for procedure for purposes other than remedying health state, unspecified: Secondary | ICD-10-CM | POA: Diagnosis not present

## 2024-04-17 MED ORDER — BENZONATATE 100 MG PO CAPS: 100.0000 mg | ORAL_CAPSULE | Freq: Two times a day (BID) | ORAL | 0 refills | Status: DC | PRN

## 2024-04-17 MED ORDER — AZELASTINE HCL 0.1 % NA SOLN: 1.0000 | Freq: Two times a day (BID) | NASAL | 0 refills | Status: DC

## 2024-04-17 NOTE — Progress Notes (Signed)
 I have spent 5 minutes in review of e-visit questionnaire, review and updating patient chart, medical decision making and response to patient.   Piedad Climes, PA-C

## 2024-04-17 NOTE — Progress Notes (Signed)

## 2024-05-17 DIAGNOSIS — Z419 Encounter for procedure for purposes other than remedying health state, unspecified: Secondary | ICD-10-CM | POA: Diagnosis not present

## 2024-06-14 ENCOUNTER — Encounter

## 2024-06-17 DIAGNOSIS — Z419 Encounter for procedure for purposes other than remedying health state, unspecified: Secondary | ICD-10-CM | POA: Diagnosis not present

## 2024-07-18 DIAGNOSIS — Z419 Encounter for procedure for purposes other than remedying health state, unspecified: Secondary | ICD-10-CM | POA: Diagnosis not present

## 2024-08-11 ENCOUNTER — Emergency Department
Admission: EM | Admit: 2024-08-11 | Discharge: 2024-08-11 | Discharge status: Against Medical Advice | Attending: Emergency Medicine | Admitting: Emergency Medicine

## 2024-08-11 ENCOUNTER — Encounter: Payer: Self-pay | Admitting: *Deleted

## 2024-08-11 ENCOUNTER — Other Ambulatory Visit: Payer: Self-pay

## 2024-08-11 DIAGNOSIS — Z5321 Procedure and treatment not carried out due to patient leaving prior to being seen by health care provider: Secondary | ICD-10-CM | POA: Diagnosis not present

## 2024-08-11 DIAGNOSIS — M549 Dorsalgia, unspecified: Secondary | ICD-10-CM | POA: Diagnosis not present

## 2024-08-11 NOTE — ED Triage Notes (Signed)
 Pt ambulatory to triage.  Pt has back pain.  No known injury.  Pt denies urinary sx.  Pt alert   speech clear.

## 2024-08-12 ENCOUNTER — Telehealth: Admitting: Family Medicine

## 2024-08-12 DIAGNOSIS — R200 Anesthesia of skin: Secondary | ICD-10-CM

## 2024-08-12 DIAGNOSIS — M549 Dorsalgia, unspecified: Secondary | ICD-10-CM

## 2024-08-12 NOTE — Progress Notes (Signed)

## 2024-09-07 ENCOUNTER — Telehealth: Admitting: Family

## 2024-09-07 DIAGNOSIS — J069 Acute upper respiratory infection, unspecified: Secondary | ICD-10-CM

## 2024-09-07 MED ORDER — BENZONATATE 100 MG PO CAPS: 100.0000 mg | ORAL_CAPSULE | Freq: Three times a day (TID) | ORAL | 0 refills | Status: AC | PRN

## 2024-09-07 MED ORDER — LIDOCAINE VISCOUS HCL 2 % MT SOLN: 15.0000 mL | OROMUCOSAL | 0 refills | Status: AC | PRN

## 2024-09-07 MED ORDER — FLUTICASONE PROPIONATE 50 MCG/ACT NA SUSP: 2.0000 | Freq: Every day | NASAL | 6 refills | Status: AC

## 2024-09-07 NOTE — Progress Notes (Signed)

## 2024-10-14 ENCOUNTER — Telehealth: Admitting: Physician Assistant

## 2024-10-14 DIAGNOSIS — G43009 Migraine without aura, not intractable, without status migrainosus: Secondary | ICD-10-CM

## 2024-10-14 DIAGNOSIS — G43819 Other migraine, intractable, without status migrainosus: Secondary | ICD-10-CM

## 2024-10-14 MED ORDER — SUMATRIPTAN SUCCINATE 50 MG PO TABS: 50.0000 mg | ORAL_TABLET | Freq: Once | ORAL | 0 refills | Status: AC

## 2024-10-14 MED ORDER — ONDANSETRON 4 MG PO TBDP: 4.0000 mg | ORAL_TABLET | Freq: Three times a day (TID) | ORAL | 0 refills | Status: AC | PRN

## 2024-10-14 NOTE — Progress Notes (Signed)
 Virtual Visit Consent   Suzanne Myers, you are scheduled for a virtual visit with a [facility] provider today. Just as with appointments in the office, your consent must be obtained to participate. Your consent will be active for this visit and any virtual visit you may have with one of our providers in the next 365 days. If you have a MyChart account, a copy of this consent can be sent to you electronically.  As this is a virtual visit, video technology does not allow for your provider to perform a traditional examination. This may limit your provider's ability to fully assess your condition. If your provider identifies any concerns that need to be evaluated in person or the need to arrange testing (such as labs, EKG, etc.), we will make arrangements to do so. Although advances in technology are sophisticated, we cannot ensure that it will always work on either your end or our end. If the connection with a video visit is poor, the visit may have to be switched to a telephone visit. With either a video or telephone visit, we are not always able to ensure that we have a secure connection.  By engaging in this virtual visit, you consent to the provision of healthcare and authorize for your insurance to be billed (if applicable) for the services provided during this visit. Depending on your insurance coverage, you may receive a charge related to this service.  I need to obtain your verbal consent now. Are you willing to proceed with your visit today? Suzanne Myers has provided verbal consent on 10/14/2024 for a virtual visit (video or telephone). Suzanne Myers, [place]  Date: 10/14/2024 12:23 PM   Virtual Visit via Video Note   I, Suzanne Myers, connected with  Suzanne Myers  ([id number], 26-Aug-1981) on 10/14/24 at 12:15 PM EST by a video-enabled telemedicine application and verified that I am speaking with the correct person using two identifiers.  Location: Patient: Virtual  Visit Location Patient: Home Provider: Virtual Visit Location Provider: Home Office   I discussed the limitations of evaluation and management by telemedicine and the availability of in person appointments. The patient expressed understanding and agreed to proceed.    History of Present Illness: Suzanne Myers is a 43 y.o. who identifies as a female who was assigned female at birth, and is being seen today for headache starting yesterday that was throbbing and with some nausea, worsening into this morning with increased nausea and a few episodes of non-bloody emesis, photophobia and phonophobia. Denies fever, chills. Gets migraines every-so-often. This feels identical to previous migraines.  OTC -- Tylenol /Ibuprofen .  HPI: HPI  Problems:  Patient Active Problem List   Diagnosis Date Noted   High cholesterol 11/09/2022   Chronic back pain greater than 3 months duration 02/06/2014   Dorsalgia 02/06/2014    Allergies:  Allergies  Allergen Reactions   Hydrocodone-Acetaminophen  Itching   Codeine Rash   Medications:  Current Outpatient Medications:    ondansetron  (ZOFRAN -ODT) 4 MG disintegrating tablet, Take 1 tablet (4 mg total) by mouth every 8 (eight) hours as needed for nausea or vomiting., Disp: 20 tablet, Rfl: 0   SUMAtriptan  (IMITREX ) 50 MG tablet, Take 1 tablet (50 mg total) by mouth once for 1 dose. May repeat in 2 hours if headache persists or recurs., Disp: 10 tablet, Rfl: 0   benzonatate  (TESSALON  PERLES) 100 MG capsule, Take 1 capsule (100 mg total) by mouth 3 (three) times daily as needed., Disp: 20  capsule, Rfl: 0   fluticasone  (FLONASE ) 50 MCG/ACT nasal spray, Place 2 sprays into both nostrils daily., Disp: 16 g, Rfl: 6   lidocaine  (XYLOCAINE ) 2 % solution, Use as directed 15 mLs in the mouth or throat as needed for mouth pain., Disp: 100 mL, Rfl: 0   rosuvastatin (CRESTOR) 5 MG tablet, Take 5 mg by mouth daily., Disp: , Rfl:   Observations/Objective: Patient is  well-developed, well-nourished in no acute distress.  Resting comfortably  at home.  Head is normocephalic, atraumatic.  No labored breathing.  Speech is clear and coherent with logical content.  Patient is alert and oriented at baseline.   Assessment and Plan: 1. Other migraine without status migrainosus, intractable (Primary) - ondansetron  (ZOFRAN -ODT) 4 MG disintegrating tablet; Take 1 tablet (4 mg total) by mouth every 8 (eight) hours as needed for nausea or vomiting.  Dispense: 20 tablet; Refill: 0 - SUMAtriptan  (IMITREX ) 50 MG tablet; Take 1 tablet (50 mg total) by mouth once for 1 dose. May repeat in 2 hours if headache persists or recurs.  Dispense: 10 tablet; Refill: 0  No alarm signs or symptoms present. Start Zofran  for nausea. Supportive measures and OTC medications reviewed. Imitrex  per orders. Strict in-person follow-up precautions discussed.  Follow Up Instructions: I discussed the assessment and treatment plan with the patient. The patient was provided an opportunity to ask questions and all were answered. The patient agreed with the plan and demonstrated an understanding of the instructions.  A copy of instructions were sent to the patient via MyChart unless otherwise noted below.   The patient was advised to call back or seek an in-person evaluation if the symptoms worsen or if the condition fails to improve as anticipated.    Suzanne Velma Lunger, PA-C

## 2024-10-14 NOTE — Patient Instructions (Signed)
 Suzanne Myers, thank you for joining Suzanne Velma Lunger, PA-C for today's virtual visit.  While this provider is not your primary care provider (PCP), if your PCP is located in our provider database this encounter information will be shared with them immediately following your visit.   A [name] account gives you access to today's visit and all your visits, tests, and labs performed at [facility]  click here if you don't have a [name] account or go to [email or web address]  Consent: (Patient) Suzanne Myers provided verbal consent for this virtual visit at the beginning of the encounter.  Current Medications:  Current Outpatient Medications:    azelastine  (ASTELIN ) 0.1 % nasal spray, Place 1 spray into both nostrils 2 (two) times daily. Use in each nostril as directed, Disp: 30 mL, Rfl: 0   benzonatate  (TESSALON  PERLES) 100 MG capsule, Take 1 capsule (100 mg total) by mouth 3 (three) times daily as needed., Disp: 20 capsule, Rfl: 0   fluticasone  (FLONASE ) 50 MCG/ACT nasal spray, Place 2 sprays into both nostrils daily., Disp: 16 g, Rfl: 6   lidocaine  (XYLOCAINE ) 2 % solution, Use as directed 15 mLs in the mouth or throat as needed for mouth pain., Disp: 100 mL, Rfl: 0   rosuvastatin (CRESTOR) 5 MG tablet, Take 5 mg by mouth daily., Disp: , Rfl:    Medications ordered in this encounter:  No orders of the defined types were placed in this encounter.    *If you need refills on other medications prior to your next appointment, please contact your pharmacy*  Follow-Up: Call back or seek an in-person evaluation if the symptoms worsen or if the condition fails to improve as anticipated.  [facility] [phone]  Other Instructions Migraine Headache A migraine headache is a very strong throbbing pain on one or both sides of your head. This type of headache can also cause other symptoms. It can last from 4 hours to 3 days.  Talk with your doctor about what things may bring on (trigger) this condition. What are the causes? The exact cause of a migraine is not known. This condition may be brought on or caused by: Smoking. Medicines, such as: Medicine used to treat chest pain (nitroglycerin). Birth control pills. Estrogen. Some blood pressure medicines. Certain substances in some foods or drinks. Foods and drinks, such as: Cheese. Chocolate. Alcohol. Caffeine. Doing physical activity that is very hard. Other things that may trigger a migraine headache include: Periods. Pregnancy. Hunger. Stress. Getting too much or too little sleep. Weather changes. Feeling tired (fatigue). What increases the risk? Being 37-42 years old. Being female. Having a family history of migraine headaches. Being Caucasian. Having a mental health condition, such as being sad (depressed) or feeling worried or nervous (anxious). Being very overweight (obese). What are the signs or symptoms? A throbbing pain. This pain may: Happen in any area of the head, such as on one or both sides. Make it hard to do daily activities. Get worse with physical activity. Get worse around bright lights, loud noises, or smells. Other symptoms may include: Feeling like you may vomit (nauseous). Vomiting. Dizziness. Before a migraine headache starts, you may get warning signs (an aura). An aura may include: Seeing flashing lights or having blind spots. Seeing bright spots, halos, or zigzag lines. Having tunnel vision or blurred vision. Having numbness or a tingling feeling. Having trouble talking. Having weak muscles. After a migraine ends, you may have symptoms. These may  include: Tiredness. Trouble thinking (concentrating). How is this treated? Taking medicines that: Relieve pain. Relieve the feeling like you may vomit. Prevent migraine headaches. Treatment may also include: Acupuncture. Lifestyle changes like avoiding foods that  bring on migraine headaches. Learning ways to control your body functions (biofeedback). Therapy to help you know and deal with negative thoughts (cognitive behavioral therapy). Follow these instructions at home: Medicines Take over-the-counter and prescription medicines only as told by your doctor. If told, take steps to prevent problems with pooping (constipation). You may need to: Drink enough fluid to keep your pee (urine) pale yellow. Take medicines. You will be told what medicines to take. Eat foods that are high in fiber. These include beans, whole grains, and fresh fruits and vegetables. Limit foods that are high in fat and sugar. These include fried or sweet foods. Ask your doctor if you should avoid driving or using machines while you are taking your medicine. Lifestyle  Do not drink alcohol. Do not smoke or use any products that contain nicotine or tobacco. If you need help quitting, ask your doctor. Get 7-9 hours of sleep each night, or the amount recommended by your doctor. Find ways to deal with stress, such as meditation, deep breathing, or yoga. Try to exercise often. This can help lessen how bad and how often your migraines happen. General instructions Keep a journal to find out what may bring on your migraine headaches. This can help you avoid those things. For example, write down: What you eat and drink. How much sleep you get. Any change to your medicines or diet. If you have a migraine headache: Avoid things that make your symptoms worse, such as bright lights. Lie down in a dark, quiet room. Do not drive or use machinery. Ask your doctor what activities are safe for you. Where to find more information Coalition for Headache and Migraine Patients (CHAMP): headachemigraine.org American Migraine Foundation: americanmigrainefoundation.org National Headache Foundation: headaches.org Contact a doctor if: You get a migraine headache that is different or worse than  others you have had. You have more than 15 days of headaches in one month. Get help right away if: Your migraine headache gets very bad. Your migraine headache lasts more than 72 hours. You have a fever or stiff neck. You have trouble seeing. Your muscles feel weak or like you cannot control them. You lose your balance a lot. You have trouble walking. You faint. You have a seizure. This information is not intended to replace advice given to you by your health care provider. Make sure you discuss any questions you have with your health care provider. Document Revised: 06/19/2022 Document Reviewed: 06/19/2022 Elsevier Patient Education  2024 [employer]vier [employer].   If you have been instructed to have an in-person evaluation today at a local Urgent Care facility, please use the link below. It will take you to a list of all of our available [facility], including address, phone number and hours of operation. Please do not delay care.  [facility]  If you or a family member do not have a primary care provider, use the link below to schedule a visit and establish care. When you choose a [facility] primary care physician or advanced practice provider, you gain a long-term partner in health. Find a Primary Care Provider  Learn more about [facility]'s in-office and virtual care options: [facility] - Get Care Now

## 2024-10-14 NOTE — Progress Notes (Signed)
   Thank you for the details you included in the comment boxes. Those details are very helpful in determining the best course of treatment for you and help us  to provide the best care.Because of active migraine which we cannot fully assess or treat via e-visit, we recommend that you schedule a Virtual Urgent Care video visit in order for the provider to better assess what is going on.  The provider will be able to give you a more accurate diagnosis and treatment plan if we can more freely discuss your symptoms and with the addition of a virtual examination.   If you change your visit to a video visit, we will bill your insurance (similar to an office visit) and you will not be charged for this e-Visit. You will be able to stay at home and speak with the first available [facility] advanced practice provider. The link to do a video visit is in the drop down Menu tab of your Welcome screen in MyChart.
